# Patient Record
Sex: Male | Born: 1990 | Race: Black or African American | Hispanic: No | Marital: Single | Smoking: Never smoker
Health system: Southern US, Community
[De-identification: ages and names within clinical notes are randomized; demographics above are authoritative.]

## PROBLEM LIST (undated history)

## (undated) HISTORY — PX: NO PAST SURGERIES: SHX2092

---

## 2009-07-07 ENCOUNTER — Emergency Department: Payer: Self-pay | Admitting: Emergency Medicine

## 2010-07-06 ENCOUNTER — Emergency Department: Payer: Self-pay | Admitting: Internal Medicine

## 2011-07-11 ENCOUNTER — Emergency Department: Payer: Self-pay | Admitting: Unknown Physician Specialty

## 2011-07-11 LAB — BASIC METABOLIC PANEL
Anion Gap: 12 (ref 7–16)
BUN: 12 mg/dL (ref 7–18)
Chloride: 107 mmol/L (ref 98–107)
Co2: 26 mmol/L (ref 21–32)
EGFR (African American): >60
Osmolality: 288 (ref 275–301)

## 2011-07-11 LAB — CBC WITH DIFFERENTIAL/PLATELET
Basophil #: 0 10*3/uL (ref 0.0–0.1)
Eosinophil #: 0.1 10*3/uL (ref 0.0–0.7)
Eosinophil %: 1.4 %
MCH: 29.4 pg (ref 26.0–34.0)
MCHC: 33.9 g/dL (ref 32.0–36.0)
Monocyte #: 0.8 10*3/uL — ABNORMAL HIGH (ref 0.0–0.7)
Neutrophil %: 77.3 %
Platelet: 224 10*3/uL (ref 150–440)
RBC: 5.37 10*6/uL (ref 4.40–5.90)

## 2012-03-09 ENCOUNTER — Emergency Department (HOSPITAL_COMMUNITY): Payer: Worker's Compensation

## 2012-03-09 ENCOUNTER — Emergency Department (HOSPITAL_COMMUNITY)
Admission: EM | Admit: 2012-03-09 | Discharge: 2012-03-09 | Disposition: A | Discharge status: Home / Self Care | Payer: Worker's Compensation | Attending: Emergency Medicine | Admitting: Emergency Medicine

## 2012-03-09 ENCOUNTER — Encounter (HOSPITAL_COMMUNITY): Payer: Self-pay | Admitting: Emergency Medicine

## 2012-03-09 DIAGNOSIS — W010XXA Fall on same level from slipping, tripping and stumbling without subsequent striking against object, initial encounter: Secondary | ICD-10-CM | POA: Insufficient documentation

## 2012-03-09 DIAGNOSIS — M24419 Recurrent dislocation, unspecified shoulder: Secondary | ICD-10-CM | POA: Insufficient documentation

## 2012-03-09 MED ORDER — OXYCODONE-ACETAMINOPHEN 5-325 MG PO TABS: 2.0000 | ORAL_TABLET | ORAL | Status: DC | PRN

## 2012-03-09 MED ORDER — ONDANSETRON 4 MG PO TBDP
8.0000 mg | ORAL_TABLET | Freq: Once | ORAL | Status: AC
  Administered 2012-03-09: 8 mg via ORAL

## 2012-03-09 MED ORDER — ONDANSETRON 4 MG PO TBDP
ORAL_TABLET | ORAL | Status: AC
  Filled 2012-03-09: qty 2

## 2012-03-09 MED ORDER — PROPOFOL 10 MG/ML IV BOLUS
INTRAVENOUS | Status: AC
  Filled 2012-03-09: qty 20

## 2012-03-09 MED ORDER — PROPOFOL 10 MG/ML IV BOLUS: 0.5000 mg/kg | Freq: Once | INTRAVENOUS | Status: DC

## 2012-03-09 MED ORDER — DIAZEPAM 5 MG/ML IJ SOLN
5.0000 mg | Freq: Once | INTRAMUSCULAR | Status: AC
  Administered 2012-03-09: 5 mg via INTRAVENOUS
  Filled 2012-03-09: qty 2

## 2012-03-09 MED ORDER — FENTANYL CITRATE 0.05 MG/ML IJ SOLN
50.0000 ug | Freq: Once | INTRAMUSCULAR | Status: AC
  Administered 2012-03-09: 50 ug via INTRAVENOUS
  Filled 2012-03-09: qty 2

## 2012-03-09 MED ORDER — HYDROMORPHONE HCL PF 1 MG/ML IJ SOLN
1.0000 mg | Freq: Once | INTRAMUSCULAR | Status: AC
  Administered 2012-03-09: 1 mg via INTRAVENOUS
  Filled 2012-03-09: qty 1

## 2012-03-09 MED ORDER — MELOXICAM 15 MG PO TABS: 15.0000 mg | ORAL_TABLET | Freq: Every day | ORAL | Status: DC

## 2012-03-09 MED ORDER — SODIUM CHLORIDE 0.9 % IV SOLN
INTRAVENOUS | Status: AC | PRN
  Administered 2012-03-09: 1000 mL via INTRAVENOUS

## 2012-03-09 MED ORDER — PROPOFOL 10 MG/ML IV BOLUS
INTRAVENOUS | Status: AC | PRN
  Administered 2012-03-09: 20 mg via INTRAVENOUS
  Administered 2012-03-09 (×2): 40 mg via INTRAVENOUS
  Administered 2012-03-09: 80 mg via INTRAVENOUS

## 2012-03-09 NOTE — ED Notes (Addendum)
MD at bedside. Dr. Bebe Shaggy. This RN will return to evaluate pt.

## 2012-03-09 NOTE — ED Notes (Signed)
MDs at bedside

## 2012-03-09 NOTE — Progress Notes (Signed)
Orthopedic Tech Progress Note Patient Details:  Marcus Cook 11-Oct-1990 [id number]  Ortho Devices Type of Ortho Device: Sling immobilizer Ortho Device/Splint Location: right UE Ortho Device/Splint Interventions: Application   [name] 03/09/2012, 4:54 PM

## 2012-03-09 NOTE — ED Notes (Signed)
EDPA, Robin at bedside.

## 2012-03-09 NOTE — ED Notes (Signed)
Pt ready to be discharged. Pt waiting for family to pick up from hospital. Pt is resting and still drowsy. Vitals normal.

## 2012-03-09 NOTE — ED Provider Notes (Signed)
History     CSN: [id number]  Arrival date & time 03/09/12  1158   First MD Initiated Contact with Patient 03/09/12 1200      Chief Complaint  Patient presents with  . Shoulder Injury    (Consider location/radiation/quality/duration/timing/severity/associated sxs/prior treatment) HPI Comments: Marcus Cook 21 y.o. male   The chief complaint is: Patient presents with:   Shoulder Injury    21 year old male with 10+, dislocations of Right shoulder presents today with chief complaint of right shoulder dislocation. 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 He tripped and fell onto an outstretched hand. Felt his shoulder dislocate. He is not sure if it was, posterior or anterior. Patient arrived via EMS, given 100 of fentanyl. He denies any pain at this time. Patient denies any numbness or tingling. He claims that he is able to move his fingers.    Patient is a 21 y.o. male presenting with shoulder injury. The history is provided by the patient.  Shoulder Injury This is a new problem. The current episode started today. The problem has been unchanged. Associated symptoms include joint swelling. Pertinent negatives include no abdominal pain, anorexia, arthralgias, change in bowel habit, chest pain, chills, congestion, coughing, diaphoresis, fatigue, fever, headaches, myalgias, nausea, neck pain, numbness, rash, sore throat, swollen glands, urinary symptoms, vertigo, visual change, vomiting or weakness. Exacerbated by: Movement. He has tried oral narcotics (Sling-and-swathe) for the symptoms. The treatment provided significant relief.    No past medical history on file.  No past surgical history on file.  No family history on file.  History  Substance Use Topics  . Smoking status: Not on file  . Smokeless tobacco: Not on file  . Alcohol Use: Not on file      Review of Systems  Constitutional: Negative for fever, chills, diaphoresis and fatigue.  HENT: Negative for  congestion, sore throat and neck pain.   Eyes: Negative for visual disturbance.  Respiratory: Negative for cough.   Cardiovascular: Negative for chest pain.  Gastrointestinal: Negative for nausea, vomiting, abdominal pain, anorexia and change in bowel habit.  Musculoskeletal: Positive for joint swelling. Negative for myalgias and arthralgias.  Skin: Negative for rash.  Neurological: Negative for vertigo, weakness, numbness and headaches.    Allergies  Review of patient's allergies indicates not on file.  Home Medications  No current outpatient prescriptions on file.  There were no vitals taken for this visit.  Physical Exam  Nursing note and vitals reviewed. Constitutional: He appears well-developed and well-nourished. No distress.  HENT:  Head: Normocephalic and atraumatic.  Eyes: Conjunctivae normal are normal. No scleral icterus.  Neck: Normal range of motion. Neck supple.  Cardiovascular: Normal rate, regular rhythm and normal heart sounds.   Pulmonary/Chest: Effort normal and breath sounds normal. No respiratory distress.  Abdominal: Soft. There is no tenderness.  Musculoskeletal:       Right shoulder: He exhibits deformity and pain. He exhibits no laceration, no spasm, normal pulse and normal strength.       With obvious deformity of the right shoulder. Sulcus sign present. Good radial pulses. Patient is able to move fingers. Denies any paresthesias or numbness.  Neurological: He is alert.  Skin: Skin is warm and dry. He is not diaphoretic.  Psychiatric: His behavior is normal.    ED Course  Reduction of dislocation Performed by: Arthor Captain Authorized by: Arthor Captain Consent: Verbal consent obtained. Written consent obtained. Risks and benefits: risks, benefits and alternatives were discussed Consent given by: patient  Patient understanding: patient states understanding of the procedure being performed Patient consent: the patient's understanding of the  procedure matches consent given Site marked: the operative site was marked Imaging studies: imaging studies available Patient identity confirmed: verbally with patient Patient sedated: yes Sedatives: propofol Analgesia: fentanyl Vitals: Vital signs were monitored during sedation. Patient tolerance: Patient tolerated the procedure well with no immediate complications.   (including critical care time)  Procedural sedation Performed by: Arthor Captain Consent: Verbal consent obtained. Risks and benefits: risks, benefits and alternatives were discussed Required items: required blood products, implants, devices, and special equipment available Patient identity confirmed: arm band and provided demographic data Time out: Immediately prior to procedure a "time out" was called to verify the correct patient, procedure, equipment, support staff and site/side marked as required.  Sedation type: moderate (conscious) sedation NPO time confirmed and considedered  Sedatives: PROPOFOL  Physician Time at Bedside: 4:00 PM  Vitals: Vital signs were monitored during sedation. Cardiac Monitor, pulse oximeter Patient tolerance: Patient tolerated the procedure well with no immediate complications. Comments: Pt with uneventful recovered. Returned to pre-procedural sedation baseline    Labs Reviewed - No data to display Dg Shoulder Right  03/09/2012  *RADIOLOGY REPORT*  Clinical Data: Shoulder dislocation  RIGHT SHOULDER - 2+ VIEW  Comparison: None.  Findings: Frontal and Y scapular views were obtained.  There is a subcoracoid anterior dislocation.  No fracture.  No bony destruction or erosive change.  IMPRESSION: Subcoracoid anterior dislocation.   Original Report Authenticated By: Arvin Collard. WOODRUFF III, M.D.      1. Shoulder dislocation, recurrent       MDM  Patient with a subcoracoid anterior dislocation of the right shoulder. Patient has been given to milligrams the allotted and 5 of  Valium. Attempts to reduce his shoulder without conscious sedation was unsuccessful. Hehas agreed to conscious sedation.    Post reduction films obtained, I have spoken with Dr. August Saucer who will have the patient follow up. Patient given sling, pain control and instructions for care .  I have given report of patietn for PA Johnnette Gourd who will d/c patient after observation from procedural sedation.      Arthor Captain, PA-C 03/10/12 2025  Arthor Captain, PA-C 03/10/12 2026

## 2012-03-09 NOTE — ED Notes (Signed)
Pt became nauseous upon entering waiting room, assisted to a seat, green bag given, new orders obtained and completed for Zofran 8 mg ODT

## 2012-03-09 NOTE — ED Notes (Signed)
Mother arrived and at bedside. Preparing to discharge pt.

## 2012-03-09 NOTE — ED Notes (Signed)
Pt was in blow-up obstacle course and dislocated shoulder.  150/90 100 hr 97% ra.  Pt alert oriented X4  Pain 3/10 after immobalization.  EMS gave 100 fentanyl. Per EMS shoulder is obviously deformed and dislocated

## 2012-03-11 NOTE — ED Provider Notes (Signed)
Medical screening examination/treatment/procedure(s) were conducted as a shared visit with non-physician practitioner(s) and myself.  I personally evaluated the patient during the encounter  Pt seen with PA, he had shoulder dislocation I was present for entire reduction and sedation procedure.  PT tolerated procedure well.  Initially attempted reduction with pain meds but pt did not tolerate reduction without sedation  Total sedation time - approximately 20 minutes  Joya Gaskins, MD 03/11/12 2042

## 2014-10-16 ENCOUNTER — Ambulatory Visit
Admission: RE | Admit: 2014-10-16 | Discharge: 2014-10-16 | Disposition: A | Discharge status: Home / Self Care | Payer: 59 | Source: Ambulatory Visit | Attending: Family Medicine | Admitting: Family Medicine

## 2014-10-16 ENCOUNTER — Other Ambulatory Visit: Payer: Self-pay | Admitting: Family Medicine

## 2014-10-16 DIAGNOSIS — M25579 Pain in unspecified ankle and joints of unspecified foot: Secondary | ICD-10-CM

## 2019-04-01 ENCOUNTER — Encounter: Payer: Self-pay | Admitting: Emergency Medicine

## 2019-04-01 ENCOUNTER — Emergency Department: Payer: Managed Care, Other (non HMO)

## 2019-04-01 ENCOUNTER — Emergency Department
Admission: EM | Admit: 2019-04-01 | Discharge: 2019-04-01 | Disposition: A | Discharge status: Home / Self Care | Payer: Managed Care, Other (non HMO) | Attending: Student in an Organized Health Care Education/Training Program | Admitting: Student in an Organized Health Care Education/Training Program

## 2019-04-01 DIAGNOSIS — S43005A Unspecified dislocation of left shoulder joint, initial encounter: Secondary | ICD-10-CM | POA: Diagnosis not present

## 2019-04-01 DIAGNOSIS — W010XXA Fall on same level from slipping, tripping and stumbling without subsequent striking against object, initial encounter: Secondary | ICD-10-CM | POA: Insufficient documentation

## 2019-04-01 DIAGNOSIS — S63657A Sprain of metacarpophalangeal joint of left little finger, initial encounter: Secondary | ICD-10-CM | POA: Insufficient documentation

## 2019-04-01 DIAGNOSIS — T07XXXA Unspecified multiple injuries, initial encounter: Secondary | ICD-10-CM

## 2019-04-01 DIAGNOSIS — S4992XA Unspecified injury of left shoulder and upper arm, initial encounter: Secondary | ICD-10-CM | POA: Diagnosis present

## 2019-04-01 DIAGNOSIS — Y92830 Public park as the place of occurrence of the external cause: Secondary | ICD-10-CM | POA: Diagnosis not present

## 2019-04-01 DIAGNOSIS — Y998 Other external cause status: Secondary | ICD-10-CM | POA: Insufficient documentation

## 2019-04-01 DIAGNOSIS — Y9302 Activity, running: Secondary | ICD-10-CM | POA: Diagnosis not present

## 2019-04-01 MED ORDER — BACITRACIN-NEOMYCIN-POLYMYXIN 400-5-5000 EX OINT
TOPICAL_OINTMENT | Freq: Once | CUTANEOUS | Status: AC
  Administered 2019-04-01: 1 via TOPICAL
  Filled 2019-04-01: qty 1

## 2019-04-01 MED ORDER — OXYCODONE HCL 5 MG PO TABS: 5.0000 mg | ORAL_TABLET | Freq: Three times a day (TID) | ORAL | 0 refills | Status: AC | PRN

## 2019-04-01 MED ORDER — FENTANYL CITRATE (PF) 100 MCG/2ML IJ SOLN
100.0000 ug | Freq: Once | INTRAMUSCULAR | Status: AC
  Administered 2019-04-01: 100 ug via INTRAVENOUS
  Filled 2019-04-01: qty 2

## 2019-04-01 MED ORDER — ONDANSETRON HCL 4 MG/2ML IJ SOLN
4.0000 mg | Freq: Once | INTRAMUSCULAR | Status: AC
  Administered 2019-04-01: 4 mg via INTRAVENOUS
  Filled 2019-04-01: qty 2

## 2019-04-01 MED ORDER — NAPROXEN 500 MG PO TABS: 500.0000 mg | ORAL_TABLET | Freq: Two times a day (BID) | ORAL | 0 refills | Status: DC

## 2019-04-01 NOTE — ED Provider Notes (Signed)
[facility] Emergency Department Provider Note ____________________________________________  Time seen: Approximately 8:43 PM  I have reviewed the triage vital signs and the nursing notes.   HISTORY  Chief Complaint Shoulder Pain    HPI Marcus Cook is a 28 y.o. male who presents to the emergency department for evaluation and treatment of left shoulder pain, left hand pain, and abrasions to the lower extremities.  Patient states that he was running in the park and must of tripped on a tree root and fell.  He states that his left shoulder is dislocated.  He has never dislocated the left shoulder but he has dislocated the right shoulder.  No alleviating measures attempted prior to arrival.  No past medical history on file.  There are no active problems to display for this patient.   No past surgical history on file.  Prior to Admission medications   Medication Sig Start Date End Date Taking? Authorizing Provider  loratadine (CLARITIN) 10 MG tablet Take 10 mg by mouth daily as needed. For allergies    [provider]  meloxicam (MOBIC) 15 MG tablet Take 1 tablet (15 mg total) by mouth daily. 03/09/12   Harris, Cammy Copa, PA-C  naproxen (NAPROSYN) 500 MG tablet Take 1 tablet (500 mg total) by mouth 2 (two) times daily with a meal. 04/01/19   Deandre Brannan B, FNP  oxyCODONE (ROXICODONE) 5 MG immediate release tablet Take 1 tablet (5 mg total) by mouth every 8 (eight) hours as needed. 04/01/19 03/31/20  Amberly Livas, Rulon Eisenmenger B, FNP  oxyCODONE-acetaminophen (PERCOCET) 5-325 MG per tablet Take 2 tablets by mouth every 4 (four) hours as needed for pain. 03/09/12   Arthor Captain, PA-C  simethicone (MYLICON) 80 MG chewable tablet Chew 80 mg by mouth every 6 (six) hours as needed. For gas    [provider]    Allergies Patient has no known allergies.  No family history on file.  Social History Social History   Tobacco Use  . Smoking status:  Never Smoker  . Smokeless tobacco: Never Used  Substance Use Topics  . Alcohol use: Yes    Comment: occasionally   . Drug use: No    Review of Systems Constitutional: Negative for fever. Cardiovascular: Negative for chest pain. Respiratory: Negative for shortness of breath. Musculoskeletal: Positive for left shoulder pain.  Positive for left hand pain. Skin: Positive for scattered abrasions along the hands and knees. Neurological: Negative for decrease in sensation  ____________________________________________   PHYSICAL EXAM:  VITAL SIGNS: ED Triage Vitals  Enc Vitals Group     BP 04/01/19 2022 (!) 156/104     Pulse Rate 04/01/19 2022 61     Resp 04/01/19 2022 18     Temp 04/01/19 2022 98.8 F (37.1 C)     Temp Source 04/01/19 2022 Oral     SpO2 04/01/19 2022 100 %     Weight --      Height --      Head Circumference --      Peak Flow --      Pain Score 04/01/19 2025 4     Pain Loc --      Pain Edu? --      Excl. in GC? --     Constitutional: Alert and oriented. Well appearing and in no acute distress. Eyes: Conjunctivae are clear without discharge or drainage Head: Atraumatic Neck: Supple. Respiratory: No cough. Respirations are even and unlabored. Musculoskeletal: Obvious step off deformity of the left shoulder  Neurologic: Sensory function of the left hand is intact. Skin: Abrasions noted to the prepatellar surfaces on both knees. Psychiatric: Affect and behavior are appropriate.  ____________________________________________   LABS (all labs ordered are listed, but only abnormal results are displayed)  Labs Reviewed - No data to display ____________________________________________  RADIOLOGY  Anterior, inferior left shoulder dislocation. ____________________________________________   PROCEDURES  .Ortho Injury Treatment  Date/Time: 04/01/2019 10:08 PM Performed by: Victorino Dike, FNP Authorized by: Victorino Dike, FNP   Consent:     Consent obtained:  Verbal   Consent given by:  Patient   Risks discussed:  Irreducible dislocation, fracture and nerve damageInjury location: shoulder Location details: left shoulder Injury type: dislocation Dislocation type: anterior Pre-procedure neurovascular assessment: neurovascularly intact Pre-procedure distal perfusion: normal Pre-procedure neurological function: normal Pre-procedure range of motion: reduced  Patient sedated: NoManipulation performed: yes Reduction method: external rotation, traction and counter traction and scapular manipulation Reduction successful: yes X-ray confirmed reduction: yes Immobilization: Shoulder Immobilizer. Supplies used: Velcro shoulder immobilizer. Post-procedure neurovascular assessment: post-procedure neurovascularly intact Post-procedure distal perfusion: normal Post-procedure neurological function: normal Patient tolerance: patient tolerated the procedure well with no immediate complications     ____________________________________________   INITIAL IMPRESSION / ASSESSMENT AND PLAN / ED COURSE  Marcus Cook is a 28 y.o. who presents to the emergency department for treatment and evaluation after mechanical, nonsyncopal fall prior to arrival.  He has an obvious left shoulder dislocation. Will attempt reduction after Fentanyl. If more sedation is required, he will need to be moved for MD monitoring.  ----------------------------------------- 10:07 PM on 04/01/2019 -----------------------------------------  Patient tolerated the procedure well. Reduction completed by myself and Betha Loa, PA-C. Post-reduction image shows no fracture or dislocation. Patient states pain is "2 or 3." See procedure note for details.   Wound care instructions provided.  Patient instructed to follow-up with orthopedics as soon as possible. He was also instructed to return to the emergency department for symptoms that change or worsen if  unable schedule an appointment with orthopedics or primary care.  Medications  neomycin-bacitracin-polymyxin (NEOSPORIN) ointment packet (has no administration in time range)  fentaNYL (SUBLIMAZE) injection 100 mcg (100 mcg Intravenous Given 04/01/19 2041)  ondansetron (ZOFRAN) injection 4 mg (4 mg Intravenous Given 04/01/19 2038)    Pertinent labs & imaging results that were available during my care of the patient were reviewed by me and considered in my medical decision making (see chart for details).  _________________________________________   FINAL CLINICAL IMPRESSION(S) / ED DIAGNOSES  Final diagnoses:  Shoulder dislocation, left, initial encounter  Abrasions of multiple sites  Sprain of metacarpophalangeal (MCP) joint of left little finger, initial encounter    ED Discharge Orders         Ordered    oxyCODONE (ROXICODONE) 5 MG immediate release tablet  Every 8 hours PRN     04/01/19 2204    naproxen (NAPROSYN) 500 MG tablet  2 times daily with meals     04/01/19 2204           If controlled substance prescribed during this visit, 12 month history viewed on the [place] prior to issuing an initial prescription for Schedule II or III opiod.   Victorino Dike, FNP 04/01/19 2215    Merlyn Lot, MD 04/01/19 2352

## 2019-04-01 NOTE — ED Notes (Signed)
Patient tolerated reduction of left shoulder well.

## 2019-04-01 NOTE — ED Triage Notes (Signed)
[place] he [place] over root and landed onto knees. Pt with obvious right sided shoulder dislocation.

## 2019-04-01 NOTE — Discharge Instructions (Signed)
Please call and schedule a follow up appointment with Dr. Mack Guise.  Keep your wounds clean and dry.   You may take the immobilizer off to shower, but DO NOT raise your arm above your head.  Return to the ER for symptoms of concern if unable to see your primary care provider or the specialist.

## 2019-04-01 NOTE — ED Notes (Signed)
Patient taken to xray.

## 2019-09-14 ENCOUNTER — Ambulatory Visit: Payer: Managed Care, Other (non HMO) | Attending: Internal Medicine

## 2019-09-14 DIAGNOSIS — Z23 Encounter for immunization: Secondary | ICD-10-CM

## 2019-09-14 NOTE — Progress Notes (Signed)
   Covid-19 Vaccination Clinic  Name:  Marcus Cook    MRN: [id number] DOB: 1990/07/30  09/14/2019  [name] was observed post Covid-19 immunization for 15 minutes without incident. He was provided with Vaccine Information Sheet and instruction to access the V-Safe system.   [name] was instructed to call 911 with any severe reactions post vaccine: Marland Kitchen Difficulty breathing  . Swelling of face and throat  . A fast heartbeat  . A bad rash all over body  . Dizziness and weakness   Immunizations Administered    Name Date Dose VIS Date Route   Pfizer COVID-19 Vaccine 09/14/2019  1:14 PM 0.3 mL 05/17/2019 Intramuscular   Manufacturer: [employer], [employer]   Lot: [id number]   NDC: [address]-4

## 2019-10-08 ENCOUNTER — Ambulatory Visit: Payer: Managed Care, Other (non HMO) | Attending: Internal Medicine

## 2019-10-08 DIAGNOSIS — Z23 Encounter for immunization: Secondary | ICD-10-CM

## 2019-10-08 NOTE — Progress Notes (Signed)
   Covid-19 Vaccination Clinic  Name:  Marcus Cook    MRN: [id number] DOB: 11-11-1990  10/08/2019  Mr. Marcus Cook was observed post Covid-19 immunization for 15 minutes without incident. He was provided with Vaccine Information Sheet and instruction to access the V-Safe system.   Mr. Marcus Cook was instructed to call 911 with any severe reactions post vaccine: Marland Kitchen Difficulty breathing  . Swelling of face and throat  . A fast heartbeat  . A bad rash all over body  . Dizziness and weakness   Immunizations Administered    Name Date Dose VIS Date Route   Pfizer COVID-19 Vaccine 10/08/2019  1:24 PM 0.3 mL 07/31/2018 Intramuscular   Manufacturer: [employer], [employer]   Lot: [id number]   NDC: [address]-5

## 2021-02-24 ENCOUNTER — Ambulatory Visit (INDEPENDENT_AMBULATORY_CARE_PROVIDER_SITE_OTHER): Payer: Managed Care, Other (non HMO) | Admitting: Family Medicine

## 2021-02-24 ENCOUNTER — Encounter: Payer: Self-pay | Admitting: Family Medicine

## 2021-02-24 ENCOUNTER — Other Ambulatory Visit: Payer: Self-pay

## 2021-02-24 VITALS — BP 145/107 | HR 81 | Temp 98.4°F | Ht 71.0 in | Wt 165.0 lb

## 2021-02-24 DIAGNOSIS — Z23 Encounter for immunization: Secondary | ICD-10-CM

## 2021-02-24 DIAGNOSIS — I10 Essential (primary) hypertension: Secondary | ICD-10-CM

## 2021-02-24 MED ORDER — AMLODIPINE BESYLATE 2.5 MG PO TABS: 2.5000 mg | ORAL_TABLET | Freq: Every day | ORAL | 3 refills | Status: DC

## 2021-02-24 NOTE — Patient Instructions (Signed)
Please review the attached list of medications and notify my office if there are any errors.   Please bring a copy of you most recent lab work to your next appointment

## 2021-02-24 NOTE — Progress Notes (Signed)
Established patient visit   Patient: Marcus Cook   DOB: 06/29/1990   30 y.o. Male  MRN: [id number] Visit Date: 02/24/2021  Today's healthcare provider: [name], MD   Chief Complaint  Patient presents with   Hypertension   Subjective    Hypertension This is a new problem. The problem has been gradually worsening since onset. The problem is uncontrolled. Associated symptoms include anxiety. Pertinent negatives include no blurred vision, headaches, malaise/fatigue, neck pain, palpitations, peripheral edema or shortness of breath. There are no associated agents to hypertension. Past treatments include nothing.   He states he found out he had high blood pressure through recent screening done at [employer] where he works. He runs for exercise about 30 minutes 3 times a week, but also admits to consuming a lot of salty foods.    Medications: Outpatient Medications Prior to Visit  Medication Sig   loratadine (CLARITIN) 10 MG tablet Take 10 mg by mouth daily as needed. For allergies   meloxicam (MOBIC) 15 MG tablet Take 1 tablet (15 mg total) by mouth daily.   naproxen (NAPROSYN) 500 MG tablet Take 1 tablet (500 mg total) by mouth 2 (two) times daily with a meal.   oxyCODONE-acetaminophen (PERCOCET) 5-325 MG per tablet Take 2 tablets by mouth every 4 (four) hours as needed for pain.   simethicone (MYLICON) 80 MG chewable tablet Chew 80 mg by mouth every 6 (six) hours as needed. For gas   No facility-administered medications prior to visit.    Review of Systems  Constitutional: Negative.  Negative for malaise/fatigue.  Eyes: Negative.  Negative for blurred vision.  Respiratory: Negative.  Negative for shortness of breath.   Cardiovascular: Negative.  Negative for palpitations.  Gastrointestinal:  Positive for abdominal distention. Negative for abdominal pain, anal bleeding, blood in stool, constipation, diarrhea, nausea, rectal pain and vomiting.  Musculoskeletal:   Negative for neck pain.  Neurological:  Negative for dizziness, syncope, speech difficulty, light-headedness, numbness and headaches.      Objective    BP (!) 145/107 (BP Location: Right Arm, Patient Position: Sitting, Cuff Size: Large)   Pulse 81   Temp 98.4 F (36.9 C) (Oral)   Ht 5\' 11"  (1.803 m)   Wt 165 lb (74.8 kg)   SpO2 100%   BMI 23.01 kg/m    Physical Exam   General: Appearance:    Well developed, well nourished male in no acute distress  Eyes:    PERRL, conjunctiva/corneas clear, EOM's intact       Lungs:     Clear to auscultation bilaterally, respirations unlabored  Heart:    Normal heart rate. Normal rhythm. No murmurs, rubs, or gallops.    MS:   All extremities are intact.    Neurologic:   Awake, alert, oriented x 3. No apparent focal neurological defect.         Assessment & Plan     1. Primary hypertension Counseled to reduce sodium in diet. Continuie regular exercise. Asked to bring a copy of his recent [facility] screening labs to his follow up visit. Will start amLODipine (NORVASC) 2.5 MG tablet; Take 1 tablet (2.5 mg total) by mouth daily.  Dispense: 30 tablet; Refill: 3  2. Need for influenza vaccination  - Flu Vaccine QUAD 6+ mos PF IM (Fluarix Quad PF)   Future Appointments  Date Time Provider Department Center  04/09/2021  4:00 PM 13/09/2020 Sherrie Mustache, MD [facility]  The entirety of the information documented in the History of Present Illness, Review of Systems and Physical Exam were personally obtained by me. Portions of this information were initially documented by the CMA and reviewed by me for thoroughness and accuracy.     Lelon Huh, MD  [facility] [phone] (phone) [phone] (fax)  [place]

## 2021-04-09 ENCOUNTER — Other Ambulatory Visit: Payer: Self-pay

## 2021-04-09 ENCOUNTER — Ambulatory Visit (INDEPENDENT_AMBULATORY_CARE_PROVIDER_SITE_OTHER): Payer: Managed Care, Other (non HMO) | Admitting: Family Medicine

## 2021-04-09 ENCOUNTER — Encounter: Payer: Self-pay | Admitting: Family Medicine

## 2021-04-09 VITALS — BP 132/89 | HR 112 | Temp 98.5°F | Resp 16 | Wt 163.0 lb

## 2021-04-09 DIAGNOSIS — I10 Essential (primary) hypertension: Secondary | ICD-10-CM | POA: Diagnosis not present

## 2021-04-09 NOTE — Progress Notes (Signed)
Established patient visit   Patient: Marcus Cook   DOB: 1991/04/30   30 y.o. Male  MRN: [id number] Visit Date: 04/09/2021  Today's healthcare provider: [name], MD   Chief Complaint  Patient presents with   Hypertension   Subjective    HPI  Hypertension, follow-up  BP Readings from Last 3 Encounters:  04/09/21 132/89  02/24/21 (!) 145/107  04/01/19 (!) 167/94   Wt Readings from Last 3 Encounters:  04/09/21 163 lb (73.9 kg)  02/24/21 165 lb (74.8 kg)     He was last seen for hypertension 02/24/2021.  BP at that visit was 145/107. During that visit patient was counseling to reduce sodium in diet and to continue regular exercise. Patient was asked to bring a copy of his recent [facility] screening labs to his follow up visit. Started amLODipine (NORVASC) 2.5 MG tablet; Take 1 tablet (2.5 mg total) by mouth daily.  He reports fair compliance with treatment. He is not having side effects.  He is following a Regular diet. He is exercising. He does not smoke.  Use of agents associated with hypertension: none.   Outside blood pressures are not checked. Symptoms: No chest pain No chest pressure  No palpitations No syncope  No dyspnea No orthopnea  No paroxysmal nocturnal dyspnea No lower extremity edema   Pertinent labs: No results found for: CHOL, HDL, LDLCALC, LDLDIRECT, TRIG, CHOLHDL Lab Results  Component Value Date   NA 145 07/11/2011   K 3.5 07/11/2011   CREATININE 1.06 07/11/2011   GFRNONAA >60 07/11/2011   GLUCOSE 97 07/11/2011     The ASCVD Risk score (Arnett DK, et al., 2019) failed to calculate for the following reasons:   The 2019 ASCVD risk score is only valid for ages 25 to 38   ---------------------------------------------------------------------------------------------------     Medications: Outpatient Medications Prior to Visit  Medication Sig   amLODipine (NORVASC) 2.5 MG tablet Take 1 tablet (2.5 mg total) by mouth daily.    No facility-administered medications prior to visit.    Review of Systems  Constitutional:  Negative for appetite change, chills and fever.  Respiratory:  Negative for chest tightness, shortness of breath and wheezing.   Cardiovascular:  Negative for chest pain and palpitations.  Gastrointestinal:  Negative for abdominal pain, nausea and vomiting.      Objective    BP 132/89 (BP Location: Left Arm, Patient Position: Sitting, Cuff Size: Normal)   Pulse (!) 112   Temp 98.5 F (36.9 C) (Oral)   Resp 16   Wt 163 lb (73.9 kg)   SpO2 100% Comment: room  air  BMI 22.73 kg/m  {Show previous vital signs (optional):23777}  Physical Exam   General appearance: Well developed, well nourished male, cooperative and in no acute distress Head: Normocephalic, without obvious abnormality, atraumatic Respiratory: Respirations even and unlabored, normal respiratory rate Extremities: All extremities are intact.  Skin: Skin color, texture, turgor normal. No rashes seen  Psych: Appropriate mood and affect. Neurologic: Mental status: Alert, oriented to person, place, and time, thought content appropriate.    Assessment & Plan     1. Primary hypertension Much better since starting amlodipine.  - Lipid panel - Renal function panel       The entirety of the information documented in the History of Present Illness, Review of Systems and Physical Exam were personally obtained by me. Portions of this information were initially documented by the CMA and reviewed by me for thoroughness and  accuracy.     Lelon Huh, MD  [facility] [phone] (phone) [phone] (fax)  [place]

## 2021-04-10 LAB — RENAL FUNCTION PANEL
Albumin: 4.9 g/dL (ref 4.0–5.0)
BUN/Creatinine Ratio: 8 — ABNORMAL LOW (ref 9–20)
BUN: 9 mg/dL (ref 6–20)
CO2: 23 mmol/L (ref 20–29)
Calcium: 9.7 mg/dL (ref 8.7–10.2)
Chloride: 102 mmol/L (ref 96–106)
Creatinine, Ser: 1.15 mg/dL (ref 0.76–1.27)
Glucose: 96 mg/dL (ref 70–99)
Phosphorus: 4 mg/dL (ref 2.8–4.1)
Potassium: 3.8 mmol/L (ref 3.5–5.2)
Sodium: 141 mmol/L (ref 134–144)
eGFR: 87 mL/min/{1.73_m2} (ref >59)

## 2021-04-10 LAB — LIPID PANEL
Chol/HDL Ratio: 2.2 ratio (ref 0.0–5.0)
Cholesterol, Total: 155 mg/dL (ref 100–199)
HDL: 69 mg/dL (ref >39)
LDL Chol Calc (NIH): 73 mg/dL (ref 0–99)
Triglycerides: 65 mg/dL (ref 0–149)
VLDL Cholesterol Cal: 13 mg/dL (ref 5–40)

## 2021-04-24 IMAGING — CR DG SHOULDER 2+V*L*
3 series · 3 of 3 positions shown · non-contrast
Comparison: None.

CLINICAL DATA: Shoulder pain, deformity

EXAM:
LEFT SHOULDER - 2+ VIEW

[shoulder grashey]
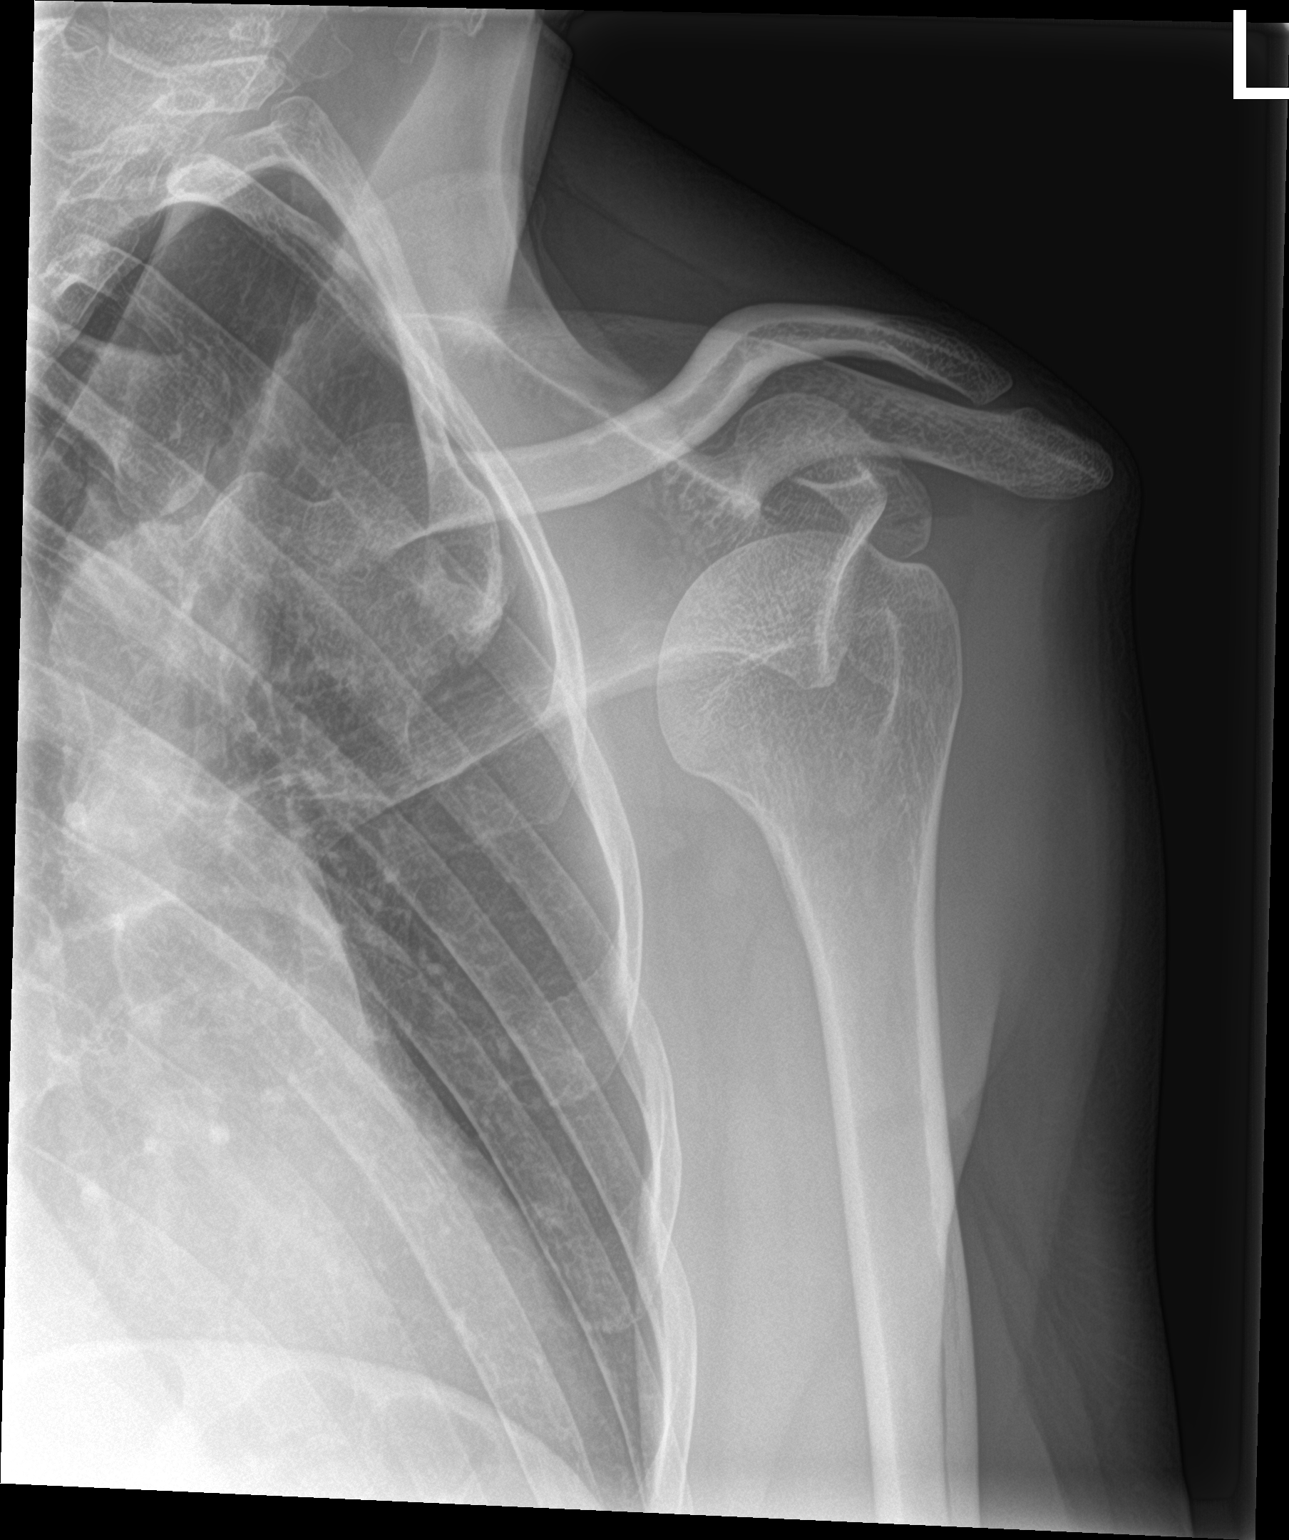

[shoulder y view]
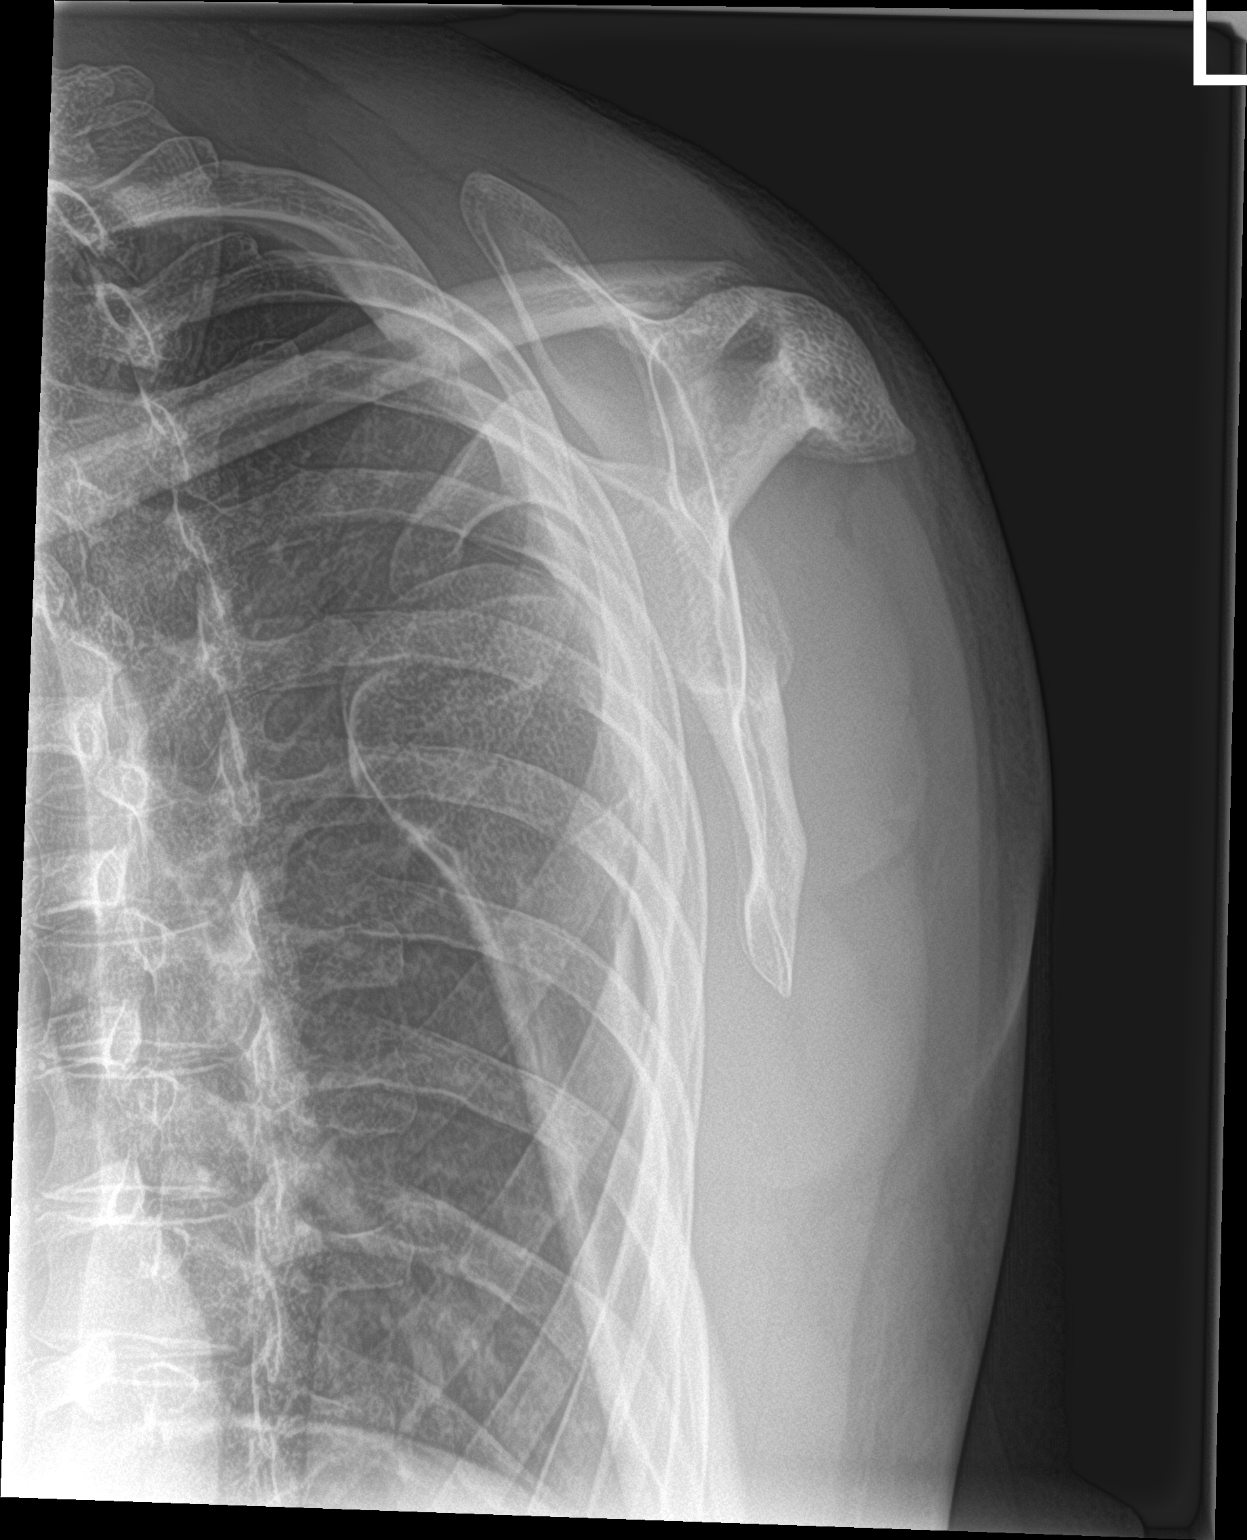

[shoulder ap neutral]
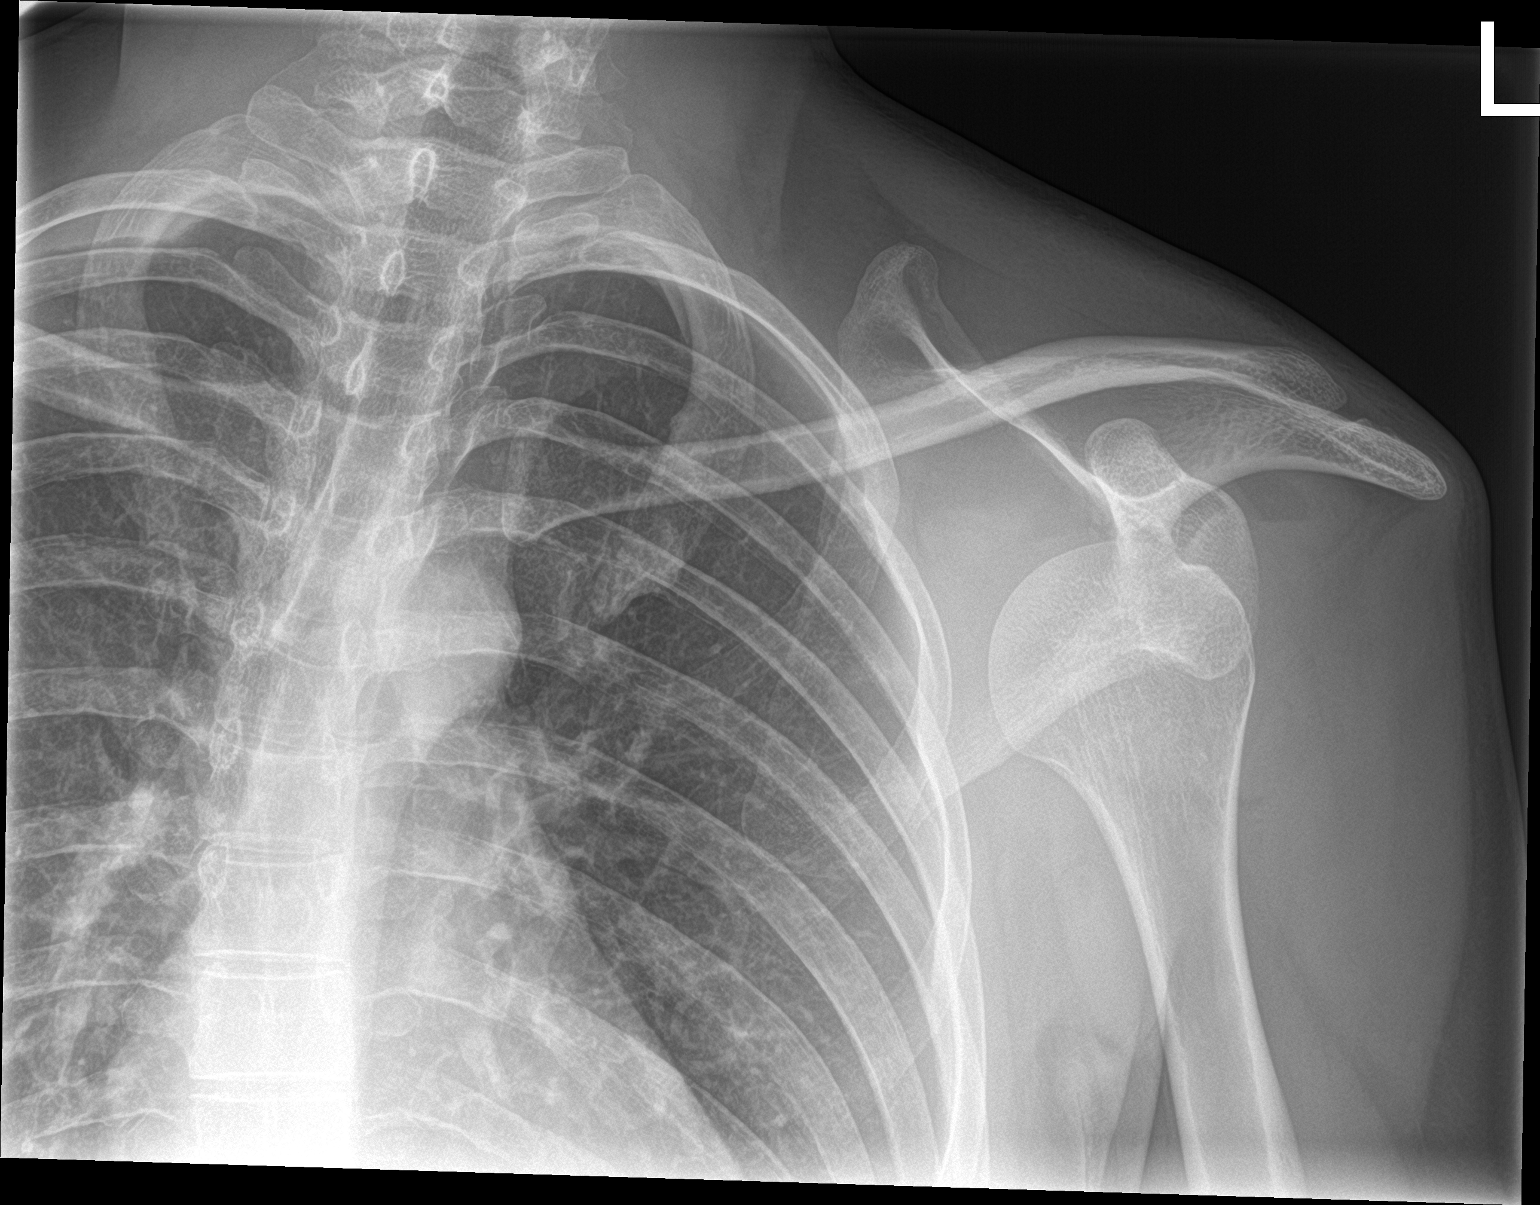

[3 of 3 positions shown; findings below may reference images not displayed]

FINDINGS: Anterior inferior dislocation of the humeral head in relation to the
glenoid. No acute osseous finding. AC joint aligned. Included left
chest unremarkable.
IMPRESSION: Anterior inferior left shoulder dislocation.

## 2021-08-20 ENCOUNTER — Ambulatory Visit: Payer: Managed Care, Other (non HMO) | Admitting: Family Medicine

## 2021-09-09 NOTE — Progress Notes (Signed)
?  ? ? ?I,Roshena L Chambers,acting as a scribe for Lelon Huh, MD.,have documented all relevant documentation on the behalf of Lelon Huh, MD,as directed by  Lelon Huh, MD while in the presence of Lelon Huh, MD.  ? ? ?Established patient visit ? ? ?Patient: Marcus Cook   DOB: [birth year]   31 y.o. Male  MRN: [id number] ?Visit Date: 09/10/2021 ? ?Today's healthcare provider: Lelon Huh, MD  ? ?Chief Complaint  ?Patient presents with  ? Hypertension  ? ?Subjective  ?  ?HPI  ?Hypertension, follow-up ? ?BP Readings from Last 3 Encounters:  ?09/10/21 (!) 157/108  ?04/09/21 132/89  ?02/24/21 (!) 145/107  ? Wt Readings from Last 3 Encounters:  ?09/10/21 168 lb (76.2 kg)  ?04/09/21 163 lb (73.9 kg)  ?02/24/21 165 lb (74.8 kg)  ?  ? ?He was last seen for hypertension 5 months ago.  ?BP at that visit was 132/89. Management since that visit includes continue same medication. ? ?He reports fair compliance with treatment. Patient has missed 4 days of medication this past week.  ?He is not having side effects.  ?He is following a Regular diet. ?He is exercising. ?He does not smoke. ? ?Use of agents associated with hypertension: none.  ? ?Outside blood pressures are not checked. ?Symptoms: ?No chest pain No chest pressure  ?No palpitations No syncope  ?No dyspnea No orthopnea  ?No paroxysmal nocturnal dyspnea No lower extremity edema  ? ?Pertinent labs ?Lab Results  ?Component Value Date  ? CHOL 155 04/09/2021  ? HDL 69 04/09/2021  ? [place] 73 04/09/2021  ? TRIG 65 04/09/2021  ? CHOLHDL 2.2 04/09/2021  ? Lab Results  ?Component Value Date  ? NA 141 04/09/2021  ? K 3.8 04/09/2021  ? CREATININE 1.15 04/09/2021  ? EGFR 87 04/09/2021  ? GLUCOSE 96 04/09/2021  ?  ? ?The ASCVD Risk score (Arnett DK, et al., 2019) failed to calculate for the following reasons: ?  The 2019 ASCVD risk score is only valid for ages 64 to  43 ? ?---------------------------------------------------------------------------------------------------  ? ?Medications: ?Outpatient Medications Prior to Visit  ?Medication Sig  ? amLODipine (NORVASC) 2.5 MG tablet Take 1 tablet (2.5 mg total) by mouth daily.  ? ?No facility-administered medications prior to visit.  ? ? ?Review of Systems  ?Constitutional:  Negative for appetite change, chills and fever.  ?Respiratory:  Negative for chest tightness, shortness of breath and wheezing.   ?Cardiovascular:  Negative for chest pain and palpitations.  ?Gastrointestinal:  Negative for abdominal pain, nausea and vomiting.  ? ? ?  Objective  ?  ?BP (!) 157/108 (BP Location: Right Arm, Patient Position: Sitting, Cuff Size: Normal)   Pulse (!) 101   Temp 98.4 ?F (36.9 ?C) (Oral)   Resp 12   Wt 168 lb (76.2 kg)   SpO2 100% Comment: room air  BMI 23.43 kg/m?  ? ? ?Today's Vitals  ? 09/10/21 1607 09/10/21 1611  ?BP: (!) 145/97 (!) 157/108  ?Pulse: (!) 101   ?Resp: 12   ?Temp: 98.4 ?F (36.9 ?C)   ?TempSrc: Oral   ?SpO2: 100%   ?Weight: 168 lb (76.2 kg)   ? ?Body mass index is 23.43 kg/m?.  ? ?Physical Exam  ? ?General: Appearance:    Well developed, well nourished male in no acute distress  ?Eyes:    PERRL, conjunctiva/corneas clear, EOM's intact       ?Lungs:     Clear to auscultation bilaterally, respirations unlabored  ?Heart:  Tachycardic. Normal rhythm. No murmurs, rubs, or gallops.    ?MS:   All extremities are intact.    ?Neurologic:   Awake, alert, oriented x 3. No apparent focal neurological defect.   ?   ?  ? ?  09/10/2021  ?  4:15 PM 02/24/2021  ?  8:40 AM  ?Depression screen PHQ 2/9  ?Decreased Interest 1 1  ?Down, Depressed, Hopeless 1 1  ?PHQ - 2 Score 2 2  ?Altered sleeping 2 2  ?Tired, decreased energy 2 1  ?Change in appetite 0 1  ?Feeling bad or failure about yourself  1 1  ?Trouble concentrating 3 2  ?Moving slowly or fidgety/restless 1 0  ?Suicidal thoughts 0 0  ?PHQ-9 Score 11 9  ?Difficult doing  work/chores Somewhat difficult Not difficult at all  ? ? ? ? ? Assessment & Plan  ?  ? ?1. Primary hypertension ?Poor compliance with medications. Has only taken sporadically since last visit. Recommend he get get daily pill pack to keep track of medications.  ?- amLODipine (NORVASC) 2.5 MG tablet; Take 1 tablet (2.5 mg total) by mouth daily.  Dispense: 30 tablet; Refill: 3 ? ?2. Anxiety and depression ?start venlafaxine XR (EFFEXOR XR) 37.5 MG 24 hr capsule; Take 1 capsule (37.5 mg total) by mouth daily with breakfast.  Dispense: 30 capsule; Refill: 1 ? ?Future Appointments  ?Date Time Provider [place]  ?10/25/2021  4:00 PM Keyvin Rison, Kirstie Peri, MD [facility]  ?  ?3. Need for tetanus, diphtheria, and acellular pertussis (Tdap) vaccine in patient of adolescent age or older ? ?- Administer Tetanus-diphtheria-acellular pertussis (Tdap) vaccine   ?   ? ?The entirety of the information documented in the History of Present Illness, Review of Systems and Physical Exam were personally obtained by me. Portions of this information were initially documented by the CMA and reviewed by me for thoroughness and accuracy.   ? ? ?Lelon Huh, MD  ?[facility] ?[phone] (phone) ?[phone] (fax) ? ?[facility]  ?

## 2021-09-10 ENCOUNTER — Encounter: Payer: Self-pay | Admitting: Family Medicine

## 2021-09-10 ENCOUNTER — Ambulatory Visit: Payer: Managed Care, Other (non HMO) | Admitting: Family Medicine

## 2021-09-10 VITALS — BP 157/108 | HR 101 | Temp 98.4°F | Resp 12 | Wt 168.0 lb

## 2021-09-10 DIAGNOSIS — F419 Anxiety disorder, unspecified: Secondary | ICD-10-CM | POA: Diagnosis not present

## 2021-09-10 DIAGNOSIS — Z23 Encounter for immunization: Secondary | ICD-10-CM | POA: Diagnosis not present

## 2021-09-10 DIAGNOSIS — I10 Essential (primary) hypertension: Secondary | ICD-10-CM | POA: Diagnosis not present

## 2021-09-10 DIAGNOSIS — F32A Depression, unspecified: Secondary | ICD-10-CM | POA: Diagnosis not present

## 2021-09-10 MED ORDER — AMLODIPINE BESYLATE 2.5 MG PO TABS: 2.5000 mg | ORAL_TABLET | Freq: Every day | ORAL | 3 refills | Status: DC

## 2021-09-10 MED ORDER — VENLAFAXINE HCL ER 37.5 MG PO CP24: 37.5000 mg | ORAL_CAPSULE | Freq: Every day | ORAL | 1 refills | Status: DC

## 2021-10-25 ENCOUNTER — Ambulatory Visit: Payer: Managed Care, Other (non HMO) | Admitting: Family Medicine

## 2021-10-25 NOTE — Progress Notes (Deleted)
      I,Jana Gwendolynn Merkey,acting as a scribe for [name], MD.,have documented all relevant documentation on the behalf of [name], MD,as directed by  [name], MD while in the presence of [name], MD.  Established patient visit   Patient: Marcus Cook   DOB: [birth year]   30 y.o. Male  MRN: [id number] Visit Date: 10/25/2021  Today's healthcare provider: [name], MD   No chief complaint on file.  Subjective    HPI  Depression, Follow-up  He  was last seen for this 6 weeks ago. Changes made at last visit include start Venlafaxine XR 37.5 daily with breakfast.  He reports {excellent/good/fair/poor:19665} compliance with treatment. He {is/is not:21021397} having side effects. *** He reports {DESC; GOOD/FAIR/POOR:18685} tolerance of treatment. Current symptoms include: {Symptoms; depression:1002} He feels he is {improved/worse/unchanged:3041574} since last visit.     09/10/2021    4:15 PM 02/24/2021    8:40 AM  Depression screen PHQ 2/9  Decreased Interest 1 1  Down, Depressed, Hopeless 1 1  PHQ - 2 Score 2 2  Altered sleeping 2 2  Tired, decreased energy 2 1  Change in appetite 0 1  Feeling bad or failure about yourself  1 1  Trouble concentrating 3 2  Moving slowly or fidgety/restless 1 0  Suicidal thoughts 0 0  PHQ-9 Score 11 9  Difficult doing work/chores Somewhat difficult Not difficult at all    -----------------------------------------------------------------------------------------   Medications: Outpatient Medications Prior to Visit  Medication Sig   amLODipine (NORVASC) 2.5 MG tablet Take 1 tablet (2.5 mg total) by mouth daily.   venlafaxine XR (EFFEXOR XR) 37.5 MG 24 hr capsule Take 1 capsule (37.5 mg total) by mouth daily with breakfast.   No facility-administered medications prior to visit.    Review of Systems  {Labs  Heme  Chem  Endocrine  Serology  Results Review (optional):23779}   Objective    There were no  vitals taken for this visit. {Show previous vital signs (optional):23777}  Physical Exam  ***  No results found for any visits on 10/25/21.  Assessment & Plan     ***  No follow-ups on file.      {provider attestation***:1}   [name], MD  [facility] [phone] (phone) [phone] (fax)  [facility]

## 2021-11-06 ENCOUNTER — Other Ambulatory Visit: Payer: Self-pay | Admitting: Family Medicine

## 2021-11-06 DIAGNOSIS — F32A Depression, unspecified: Secondary | ICD-10-CM

## 2021-11-09 ENCOUNTER — Ambulatory Visit: Payer: Managed Care, Other (non HMO) | Admitting: Family Medicine

## 2021-11-09 VITALS — BP 137/107 | HR 77 | Wt 161.0 lb

## 2021-11-09 DIAGNOSIS — F419 Anxiety disorder, unspecified: Secondary | ICD-10-CM | POA: Diagnosis not present

## 2021-11-09 DIAGNOSIS — F32A Depression, unspecified: Secondary | ICD-10-CM | POA: Diagnosis not present

## 2021-11-09 DIAGNOSIS — I10 Essential (primary) hypertension: Secondary | ICD-10-CM

## 2021-11-09 MED ORDER — VENLAFAXINE HCL ER 37.5 MG PO CP24: 37.5000 mg | ORAL_CAPSULE | Freq: Every day | ORAL | 2 refills | Status: DC

## 2021-11-09 MED ORDER — AMLODIPINE BESYLATE 2.5 MG PO TABS: 2.5000 mg | ORAL_TABLET | Freq: Every day | ORAL | 2 refills | Status: DC

## 2021-11-09 MED ORDER — AMLODIPINE BESYLATE 5 MG PO TABS: 5.0000 mg | ORAL_TABLET | Freq: Every day | ORAL | 1 refills | Status: DC

## 2021-11-09 NOTE — Progress Notes (Signed)
I,Jana Robinson,acting as a scribe for Lelon Huh, MD.,have documented all relevant documentation on the behalf of Lelon Huh, MD,as directed by  Lelon Huh, MD while in the presence of Lelon Huh, MD.    Established patient visit   Patient: Marcus Cook   DOB: 1990/09/23   31 y.o. Male  MRN: [id number] Visit Date: 11/09/2021  Today's healthcare provider: Lelon Huh, MD   No chief complaint on file.  Subjective    Hypertension, follow-up  BP Readings from Last 3 Encounters:  09/10/21 (!) 157/108  04/09/21 132/89  02/24/21 (!) 145/107   Wt Readings from Last 3 Encounters:  09/10/21 168 lb (76.2 kg)  04/09/21 163 lb (73.9 kg)  02/24/21 165 lb (74.8 kg)     He was last seen for hypertension 1 months ago.  BP at that visit was 157/108. Management since that visit includes Poor compliance with medications. Has only taken sporadically since last visit. Recommend he get get daily pill pack to keep track of medications.  - amLODipine (NORVASC) 2.5 MG daily.   He reports excellent compliance with treatment. He is not having side effects.  He is following a Regular diet. He is exercising. He does not smoke.   Symptoms: Yes chest pain No chest pressure  No palpitations No syncope  No dyspnea No orthopnea  No paroxysmal nocturnal dyspnea No lower extremity edema   Pertinent labs Lab Results  Component Value Date   CHOL 155 04/09/2021   HDL 69 04/09/2021   LDLCALC 73 04/09/2021   TRIG 65 04/09/2021   CHOLHDL 2.2 04/09/2021   Lab Results  Component Value Date   NA 141 04/09/2021   K 3.8 04/09/2021   CREATININE 1.15 04/09/2021   EGFR 87 04/09/2021   GLUCOSE 96 04/09/2021     The ASCVD Risk score (Arnett DK, et al., 2019) failed to calculate for the following reasons:   The 2019 ASCVD risk score is only valid for ages 95 to 61  ---------------------------------------------------------------------------------------------------  He is also  here to follow up for anxiety on venlafaxine which he is tolerating well and feels like it is helping with anxiety level.    Medications: Outpatient Medications Prior to Visit  Medication Sig   amLODipine (NORVASC) 2.5 MG tablet Take 1 tablet (2.5 mg total) by mouth daily.   venlafaxine XR (EFFEXOR-XR) 37.5 MG 24 hr capsule TAKE 1 CAPSULE(37.5 MG) BY MOUTH DAILY WITH BREAKFAST   No facility-administered medications prior to visit.        Objective    BP (!) 137/107 (BP Location: Left Arm, Patient Position: Sitting, Cuff Size: Normal)   Pulse 77   Wt 161 lb (73 kg)   SpO2 99%   BMI 22.45 kg/m    Physical Exam  General appearance: Well developed, well nourished male, cooperative and in no acute distress Head: Normocephalic, without obvious abnormality, atraumatic Respiratory: Respirations even and unlabored, normal respiratory rate Extremities: All extremities are intact.  Skin: Skin color, texture, turgor normal. No rashes seen  Psych: Appropriate mood and affect. Neurologic: Mental status: Alert, oriented to person, place, and time, thought content appropriate.   Assessment & Plan     1. Primary hypertension Doing well with amlodipine, bp improved  - amLODipine (NORVASC) 2.5 MG tablet; Take 1 tablet (2.5 mg total) by mouth daily.  Dispense: 90 tablet; Refill: 2  2. Anxiety and depression He feels initiation of venlafaxine has been helpful with no adverse effects. He would like to continue  current dose. refill- venlafaxine XR (EFFEXOR-XR) 37.5 MG 24 hr capsule; Take 1 capsule (37.5 mg total) by mouth daily at 6 (six) AM.  Dispense: 90 capsule; Refill: 2   Future Appointments  Date Time Provider [place]  01/21/2022  3:00 PM Caryn Section Kirstie Peri, MD [facility]        The entirety of the information documented in the History of Present Illness, Review of Systems and Physical Exam were personally obtained by me. Portions of this information were initially documented  by the CMA and reviewed by me for thoroughness and accuracy.     Lelon Huh, MD  [facility] [phone] (phone) [phone] (fax)  [place]

## 2021-11-09 NOTE — Progress Notes (Deleted)
      Established patient visit   Patient: Marcus Cook   DOB: [birth year]   31 y.o. Male  MRN: [id number] Visit Date: 11/09/2021  Today's healthcare provider: Lelon Huh, MD   No chief complaint on file.  Subjective    HPI  Hypertension, follow-up  BP Readings from Last 3 Encounters:  09/10/21 (!) 157/108  04/09/21 132/89  02/24/21 (!) 145/107   Wt Readings from Last 3 Encounters:  09/10/21 168 lb (76.2 kg)  04/09/21 163 lb (73.9 kg)  02/24/21 165 lb (74.8 kg)     He was last seen for hypertension 2 months ago.  BP at that visit was as above. Management since that visit includes none.  He reports good compliance with treatment.  Possibly missed 1 r 2 doses. He is not having side effects.  He is following a Regular diet. He is exercising. He does smoke.  Use of agents associated with hypertension: Patient asdmits to drinking energy drinks.   Outside blood pressures are not being checked. Symptoms: No chest pain No chest pressure  No palpitations No syncope  No dyspnea No orthopnea  No paroxysmal nocturnal dyspnea No lower extremity edema   Pertinent labs Lab Results  Component Value Date   CHOL 155 04/09/2021   HDL 69 04/09/2021   LDLCALC 73 04/09/2021   TRIG 65 04/09/2021   CHOLHDL 2.2 04/09/2021   Lab Results  Component Value Date   NA 141 04/09/2021   K 3.8 04/09/2021   CREATININE 1.15 04/09/2021   EGFR 87 04/09/2021   GLUCOSE 96 04/09/2021     The ASCVD Risk score (Arnett DK, et al., 2019) failed to calculate for the following reasons:   The 2019 ASCVD risk score is only valid for ages 73 to 96  ---------------------------------------------------------------------------------------------------   Medications: Outpatient Medications Prior to Visit  Medication Sig   amLODipine (NORVASC) 2.5 MG tablet Take 1 tablet (2.5 mg total) by mouth daily.   venlafaxine XR (EFFEXOR-XR) 37.5 MG 24 hr capsule TAKE 1 CAPSULE(37.5 MG) BY MOUTH DAILY  WITH BREAKFAST   No facility-administered medications prior to visit.    Review of Systems  {Labs  Heme  Chem  Endocrine  Serology  Results Review (optional):23779}   Objective    There were no vitals taken for this visit. Vitals:   11/09/21 1625 11/09/21 1626  BP: (!) 141/100 (!) 137/107  Pulse: 77   SpO2: 99%   Weight: 161 lb (73 kg)      Physical Exam  ***  No results found for any visits on 11/09/21.  Assessment & Plan     ***  No follow-ups on file.      {provider attestation***:1}   Lelon Huh, MD  [facility] [phone] (phone) [phone] (fax)  [place]

## 2022-01-20 NOTE — Progress Notes (Signed)
Established patient visit  I,Marcus Cook,acting as a scribe for Marcus Huh, MD.,have documented all relevant documentation on the behalf of Marcus Huh, MD,as directed by  Marcus Huh, MD while in the presence of Marcus Huh, MD.   Patient: Marcus Cook   DOB: January 05, 1991   31 y.o. Male  MRN: [id number] Visit Date: 01/21/2022  Today's healthcare provider: Lelon Huh, MD   Chief Complaint  Patient presents with   Follow-up   Hypertension   Subjective    HPI  Hypertension, follow-up  BP Readings from Last 3 Encounters:  01/21/22 (!) 140/94  11/09/21 (!) 137/107  09/10/21 (!) 157/108   Wt Readings from Last 3 Encounters:  01/21/22 164 lb (74.4 kg)  11/09/21 161 lb (73 kg)  09/10/21 168 lb (76.2 kg)     He was last seen for hypertension on 11/09/2021.   BP at that visit was 137/107. Management since that visit includes; increasing amlodipine from 2.$RemoveBeforeDEI'5mg'poBcrmoDOdwWTuvf$  daily to $Remove'5mg'AKLLwIv$  daily.  He reports good compliance with treatment. He is not having side effects. none He is following a Regular diet. He is exercising. He does not smoke.  Use of agents associated with hypertension: none.   Outside blood pressures are not checking.  Pertinent labs Lab Results  Component Value Date   CHOL 155 04/09/2021   HDL 69 04/09/2021   LDLCALC 73 04/09/2021   TRIG 65 04/09/2021   CHOLHDL 2.2 04/09/2021   Lab Results  Component Value Date   NA 141 04/09/2021   K 3.8 04/09/2021   CREATININE 1.15 04/09/2021   EGFR 87 04/09/2021   GLUCOSE 96 04/09/2021     The ASCVD Risk score (Arnett DK, et al., 2019) failed to calculate for the following reasons:   The 2019 ASCVD risk score is only valid for ages 73 to 82  ---------------------------------------------------------------------------------------------------   Medications: Outpatient Medications Prior to Visit  Medication Sig   amLODipine (NORVASC) 5 MG tablet Take 1 tablet (5 mg total) by mouth daily.    venlafaxine XR (EFFEXOR-XR) 37.5 MG 24 hr capsule Take 1 capsule (37.5 mg total) by mouth daily at 6 (six) AM.   No facility-administered medications prior to visit.   ROS      Objective    BP (!) 140/94 (BP Location: Right Arm, Cuff Size: Normal)   Pulse 77   Resp 16   Wt 164 lb (74.4 kg)   SpO2 100%   BMI 22.87 kg/m    Physical Exam   General appearance: Well developed, well nourished male, cooperative and in no acute distress Head: Normocephalic, without obvious abnormality, atraumatic Respiratory: Respirations even and unlabored, normal respiratory rate Extremities: All extremities are intact.  Skin: Skin color, texture, turgor normal. No rashes seen  Psych: Appropriate mood and affect. Neurologic: Mental status: Alert, oriented to person, place, and time, thought content appropriate.    Assessment & Plan     1. Primary hypertension Tolerating increase dose of amlodipine with slight improvement. Discussed increasing dose versus adding second medication. Will opt to double amLODipine (NORVASC) 5 MG tablet; to 2 tablets (10 mg total) by mouth daily, will change to $RemoveB'10mg'DqNcFTah$  tabs with next refill.   Considering family history and relatively low potassium level would consider adding MCRA if not controlled at follow up.      The entirety of the information documented in the History of Present Illness, Review of Systems and Physical Exam were personally obtained by me. Portions of this information were initially  documented by the CMA and reviewed by me for thoroughness and accuracy.     Marcus Huh, MD  [facility] [phone] (phone) [phone] (fax)  [place]

## 2022-01-21 ENCOUNTER — Ambulatory Visit: Payer: Managed Care, Other (non HMO) | Admitting: Family Medicine

## 2022-01-21 ENCOUNTER — Encounter: Payer: Self-pay | Admitting: Family Medicine

## 2022-01-21 VITALS — BP 140/94 | HR 77 | Resp 16 | Wt 164.0 lb

## 2022-01-21 DIAGNOSIS — I10 Essential (primary) hypertension: Secondary | ICD-10-CM | POA: Diagnosis not present

## 2022-01-21 MED ORDER — AMLODIPINE BESYLATE 5 MG PO TABS: 10.0000 mg | ORAL_TABLET | Freq: Every day | ORAL | Status: DC

## 2022-01-21 NOTE — Patient Instructions (Signed)
.   Please review the attached list of medications and notify my office if there are any errors.   . Please bring all of your medications to every appointment so we can make sure that our medication list is the same as yours.   

## 2022-01-25 ENCOUNTER — Other Ambulatory Visit: Payer: Self-pay | Admitting: Family Medicine

## 2022-01-25 DIAGNOSIS — I10 Essential (primary) hypertension: Secondary | ICD-10-CM

## 2022-01-25 MED ORDER — AMLODIPINE BESYLATE 10 MG PO TABS: 10.0000 mg | ORAL_TABLET | Freq: Every day | ORAL | 3 refills | Status: DC

## 2022-04-22 ENCOUNTER — Ambulatory Visit: Payer: Managed Care, Other (non HMO) | Admitting: Family Medicine

## 2022-05-04 NOTE — Progress Notes (Signed)
I,[name],acting as a scribe for Lelon Huh, MD.,have documented all relevant documentation on the behalf of Lelon Huh, MD,as directed by  Lelon Huh, MD while in the presence of Lelon Huh, MD.   Established patient visit   Patient: Marcus Cook   DOB: 20-Apr-1991   31 y.o. Male  MRN: [id number] Visit Date: 05/06/2022  Today's healthcare provider: Lelon Huh, MD   Chief Complaint  Patient presents with   Hypertension   Subjective    HPI  Hypertension, follow-up  BP Readings from Last 3 Encounters:  05/06/22 (!) 139/90  01/21/22 (!) 140/94  11/09/21 (!) 137/107   Wt Readings from Last 3 Encounters:  05/06/22 172 lb (78 kg)  01/21/22 164 lb (74.4 kg)  11/09/21 161 lb (73 kg)     He was last seen for hypertension 3 months ago.  Management since that visit includes; increased amlodipine to 5 mg bid, which was subsequently change to 11m daily.   He reports excellent compliance with treatment. He is not having side effects.  He is following a Regular diet. He is exercising. He does not smoke.  Use of agents associated with hypertension: none.   Outside blood pressures are not checked regularly.  Pertinent labs Lab Results  Component Value Date   CHOL 155 04/09/2021   HDL 69 04/09/2021   LDLCALC 73 04/09/2021   TRIG 65 04/09/2021   CHOLHDL 2.2 04/09/2021   Lab Results  Component Value Date   NA 141 04/09/2021   K 3.8 04/09/2021   CREATININE 1.15 04/09/2021   EGFR 87 04/09/2021   GLUCOSE 96 04/09/2021     The ASCVD Risk score (Arnett DK, et al., 2019) failed to calculate for the following reasons:   The 2019 ASCVD risk score is only valid for ages 439to 764 ---------------------------------------------------------------------------------------------------   Medications: Outpatient Medications Prior to Visit  Medication Sig   amLODipine (NORVASC) 10 MG tablet Take 1 tablet (10 mg total) by mouth daily.   venlafaxine XR  (EFFEXOR-XR) 37.5 MG 24 hr capsule Take 1 capsule (37.5 mg total) by mouth daily at 6 (six) AM.   No facility-administered medications prior to visit.    Review of Systems  Constitutional:  Negative for appetite change, chills and fever.  Respiratory:  Negative for chest tightness, shortness of breath and wheezing.   Cardiovascular:  Negative for chest pain and palpitations.  Gastrointestinal:  Negative for abdominal pain, nausea and vomiting.       Objective    BP (!) 139/90 (BP Location: Left Arm, Patient Position: Sitting, Cuff Size: Normal)   Pulse 92   Resp 16   Ht _0  (1.778 m)   Wt 172 lb (78 kg)   SpO2 98%   BMI 24.68 kg/m    Physical Exam   General appearance: Well developed, well nourished male, cooperative and in no acute distress Head: Normocephalic, without obvious abnormality, atraumatic Respiratory: Respirations even and unlabored, normal respiratory rate Extremities: All extremities are intact.  Skin: Skin color, texture, turgor normal. No rashes seen  Psych: Appropriate mood and affect. Neurologic: Mental status: Alert, oriented to person, place, and time, thought content appropriate.   Assessment & Plan     1. Primary hypertension Fairly well controlled on current medications.  Follow up 6 months.      The entirety of the information documented in the History of Present Illness, Review of Systems and Physical Exam were personally obtained by me. Portions of  this information were initially documented by the CMA and reviewed by me for thoroughness and accuracy.     Lelon Huh, MD  [facility] [phone] (phone) [phone] (fax)  [place]

## 2022-05-06 ENCOUNTER — Encounter: Payer: Self-pay | Admitting: Family Medicine

## 2022-05-06 ENCOUNTER — Ambulatory Visit: Payer: Managed Care, Other (non HMO) | Admitting: Family Medicine

## 2022-05-06 VITALS — BP 130/88 | HR 92 | Resp 16 | Ht 70.0 in | Wt 172.0 lb

## 2022-05-06 DIAGNOSIS — I10 Essential (primary) hypertension: Secondary | ICD-10-CM

## 2022-08-07 ENCOUNTER — Other Ambulatory Visit: Payer: Self-pay | Admitting: Family Medicine

## 2022-08-07 DIAGNOSIS — F419 Anxiety disorder, unspecified: Secondary | ICD-10-CM

## 2022-08-08 NOTE — Telephone Encounter (Signed)
Requested medication (s) are due for refill today: yes  Requested medication (s) are on the active medication list: yes  Last refill:  11/09/21 #90/2  Future visit scheduled: yes  Notes to clinic:  Unable to refill per protocol due to failed labs, no updated results.     Requested Prescriptions  Pending Prescriptions Disp Refills   venlafaxine XR (EFFEXOR-XR) 37.5 MG 24 hr capsule [Pharmacy Med Name: VENLAFAXINE ER 37.'5MG'$  CAPSULES] 90 capsule 2    Sig: TAKE 1 CAPSULE(37.5 MG) BY MOUTH DAILY AT 6 AM     Psychiatry: Antidepressants - SNRI - desvenlafaxine & venlafaxine Failed - 08/07/2022 10:15 AM      Failed - Cr in normal range and within 360 days    Creatinine  Date Value Ref Range Status  07/11/2011 1.06 0.60 - 1.30 mg/dL Final   Creatinine, Ser  Date Value Ref Range Status  04/09/2021 1.15 0.76 - 1.27 mg/dL Final         Failed - Lipid Panel in normal range within the last 12 months    Cholesterol, Total  Date Value Ref Range Status  04/09/2021 155 100 - 199 mg/dL Final   LDL Chol Calc (NIH)  Date Value Ref Range Status  04/09/2021 73 0 - 99 mg/dL Final   HDL  Date Value Ref Range Status  04/09/2021 69 >39 mg/dL Final   Triglycerides  Date Value Ref Range Status  04/09/2021 65 0 - 149 mg/dL Final         Passed - Last BP in normal range    BP Readings from Last 1 Encounters:  05/06/22 130/88         Passed - Valid encounter within last 6 months    Recent Outpatient Visits           3 months ago Primary hypertension   [place], Donald E, MD   6 months ago Primary hypertension   [place], Donald E, MD   9 months ago Primary hypertension   [place], Donald E, MD   11 months ago Primary hypertension   [place], Donald E, MD   1 year ago Primary hypertension   [place], Donald  E, MD       Future Appointments             In 3 months Fisher, Kirstie Peri, MD [facility] [place]

## 2022-11-11 ENCOUNTER — Ambulatory Visit: Payer: Managed Care, Other (non HMO) | Admitting: Family Medicine

## 2022-11-11 VITALS — BP 155/102 | HR 96 | Ht 71.0 in | Wt 183.3 lb

## 2022-11-11 DIAGNOSIS — F419 Anxiety disorder, unspecified: Secondary | ICD-10-CM

## 2022-11-11 DIAGNOSIS — F32A Depression, unspecified: Secondary | ICD-10-CM

## 2022-11-11 DIAGNOSIS — I10 Essential (primary) hypertension: Secondary | ICD-10-CM | POA: Diagnosis not present

## 2022-11-11 MED ORDER — VENLAFAXINE HCL ER 75 MG PO CP24: ORAL_CAPSULE | ORAL | 1 refills | Status: DC

## 2022-11-11 MED ORDER — AMLODIPINE BESYLATE 10 MG PO TABS: 10.0000 mg | ORAL_TABLET | Freq: Every day | ORAL | 1 refills | Status: DC

## 2022-11-11 MED ORDER — VENLAFAXINE HCL ER 75 MG PO CP24: 75.0000 mg | ORAL_CAPSULE | Freq: Every day | ORAL | 1 refills | Status: DC

## 2022-11-11 MED ORDER — VALSARTAN 80 MG PO TABS: 80.0000 mg | ORAL_TABLET | Freq: Every day | ORAL | 1 refills | Status: DC

## 2022-11-11 NOTE — Progress Notes (Signed)
I,[name],acting as a Neurosurgeon for [name], MD.,have documented all relevant documentation on the behalf of [name], MD,as directed by  [name], MD   Established patient visit   Patient: Marcus Cook   DOB: 03-Sep-1990   32 y.o. Male  MRN: [id number] Visit Date: 11/11/2022  Today's healthcare provider: [name], MD    Subjective    HPI  Hypertension, follow-up  BP Readings from Last 3 Encounters:  05/06/22 130/88  01/21/22 (!) 140/94  11/09/21 (!) 137/107   Wt Readings from Last 3 Encounters:  05/06/22 172 lb (78 kg)  01/21/22 164 lb (74.4 kg)  11/09/21 161 lb (73 kg)     He was last seen for hypertension 6 months ago.  BP at that visit was 130/88. Management since that visit includes continue current treatment.  Outside blood pressures are checked on occasions. Symptoms: No chest pain No chest pressure  No palpitations No syncope  No dyspnea No orthopnea  No paroxysmal nocturnal dyspnea No lower extremity edema   Pertinent labs Lab Results  Component Value Date   CHOL 155 04/09/2021   HDL 69 04/09/2021   LDLCALC 73 04/09/2021   TRIG 65 04/09/2021   CHOLHDL 2.2 04/09/2021   Lab Results  Component Value Date   NA 141 04/09/2021   K 3.8 04/09/2021   CREATININE 1.15 04/09/2021   EGFR 87 04/09/2021   GLUCOSE 96 04/09/2021     The ASCVD Risk score (Arnett DK, et al., 2019) failed to calculate for the following reasons:   The 2019 ASCVD risk score is only valid for ages 67 to 30 ---------------------------------------------------------------------------------------------------   Medications: Outpatient Medications Prior to Visit  Medication Sig   amLODipine (NORVASC) 10 MG tablet Take 1 tablet (10 mg total) by mouth daily.   venlafaxine XR (EFFEXOR-XR) 37.5 MG 24 hr capsule TAKE 1 CAPSULE(37.5 MG) BY MOUTH DAILY AT 6 AM   No facility-administered medications prior to visit.        Objective    BP (!) 155/102 (BP  Location: Right Arm, Patient Position: Sitting, Cuff Size: Normal)   Pulse 96   Ht 5\' 11"  (1.803 m)   Wt 183 lb 4.8 oz (83.1 kg)   BMI 25.57 kg/m    Physical Exam   General: Appearance:    Well developed, well nourished male in no acute distress  Eyes:    PERRL, conjunctiva/corneas clear, EOM's intact       Lungs:     Clear to auscultation bilaterally, respirations unlabored  Heart:    Normal heart rate. Normal rhythm. No murmurs, rubs, or gallops.    MS:   All extremities are intact.    Neurologic:   Awake, alert, oriented x 3. No apparent focal neurological defect.         Assessment & Plan     1. Primary hypertension Not too goal. Compliant with medications. Encouraged salt reduction.   add valsartan (DIOVAN) 80 MG tablet; Take 1 tablet (80 mg total) by mouth daily.  Dispense: 30 tablet; Refill: 1  refill amLODipine (NORVASC) 10 MG tablet; Take 1 tablet (10 mg total) by mouth daily.  Dispense: 90 tablet; Refill: 1  Follow up 1 month  2. Anxiety and depression He reports venlafaxine is effective and having no side effects, but feels he needs to increase dose.   Increase to venlafaxine XR (EFFEXOR-XR) 75 MG 24 hr capsule; TAKE 1 CAPSULE(37.5 MG) BY MOUTH DAILY AT 6 AM  Dispense:  90 capsule; Refill: 1      The entirety of the information documented in the History of Present Illness, Review of Systems and Physical Exam were personally obtained by me. Portions of this information were initially documented by the CMA and reviewed by me for thoroughness and accuracy.     [name], MD  [facility] [phone] (phone) [phone] (fax)  [facility]

## 2022-11-11 NOTE — Patient Instructions (Signed)
.   Please review the attached list of medications and notify my office if there are any errors.   . Please bring all of your medications to every appointment so we can make sure that our medication list is the same as yours.   

## 2022-12-18 ENCOUNTER — Other Ambulatory Visit: Payer: Self-pay | Admitting: Family Medicine

## 2022-12-18 DIAGNOSIS — I10 Essential (primary) hypertension: Secondary | ICD-10-CM

## 2022-12-23 ENCOUNTER — Encounter: Payer: Self-pay | Admitting: Family Medicine

## 2022-12-23 ENCOUNTER — Ambulatory Visit: Payer: Managed Care, Other (non HMO) | Admitting: Family Medicine

## 2022-12-23 VITALS — BP 136/88 | HR 85 | Temp 98.3°F | Resp 12 | Ht 71.0 in | Wt 185.5 lb

## 2022-12-23 DIAGNOSIS — I10 Essential (primary) hypertension: Secondary | ICD-10-CM | POA: Diagnosis not present

## 2022-12-23 DIAGNOSIS — F32A Depression, unspecified: Secondary | ICD-10-CM

## 2022-12-23 DIAGNOSIS — F419 Anxiety disorder, unspecified: Secondary | ICD-10-CM

## 2022-12-23 DIAGNOSIS — Z1159 Encounter for screening for other viral diseases: Secondary | ICD-10-CM

## 2022-12-24 LAB — CBC
Hematocrit: 44.5 % (ref 37.5–51.0)
Hemoglobin: 15.1 g/dL (ref 13.0–17.7)
MCH: 28.1 pg (ref 26.6–33.0)
MCHC: 33.9 g/dL (ref 31.5–35.7)
MCV: 83 fL (ref 79–97)
Platelets: 247 10*3/uL (ref 150–450)
RBC: 5.37 x10E6/uL (ref 4.14–5.80)
RDW: 13.1 % (ref 11.6–15.4)
WBC: 4.9 10*3/uL (ref 3.4–10.8)

## 2022-12-24 LAB — COMPREHENSIVE METABOLIC PANEL
ALT: 33 IU/L (ref 0–44)
AST: 20 IU/L (ref 0–40)
Albumin: 4.5 g/dL (ref 4.1–5.1)
Alkaline Phosphatase: 64 IU/L (ref 44–121)
BUN/Creatinine Ratio: 8 — ABNORMAL LOW (ref 9–20)
BUN: 8 mg/dL (ref 6–20)
Bilirubin Total: 0.8 mg/dL (ref 0.0–1.2)
CO2: 22 mmol/L (ref 20–29)
Calcium: 8.9 mg/dL (ref 8.7–10.2)
Chloride: 102 mmol/L (ref 96–106)
Creatinine, Ser: 1.04 mg/dL (ref 0.76–1.27)
Globulin, Total: 2.2 g/dL (ref 1.5–4.5)
Glucose: 87 mg/dL (ref 70–99)
Potassium: 3.8 mmol/L (ref 3.5–5.2)
Sodium: 141 mmol/L (ref 134–144)
Total Protein: 6.7 g/dL (ref 6.0–8.5)
eGFR: 97 mL/min/{1.73_m2} (ref >59)

## 2022-12-24 LAB — LIPID PANEL
Chol/HDL Ratio: 2.5 ratio (ref 0.0–5.0)
Cholesterol, Total: 165 mg/dL (ref 100–199)
HDL: 67 mg/dL (ref >39)
LDL Chol Calc (NIH): 84 mg/dL (ref 0–99)
Triglycerides: 71 mg/dL (ref 0–149)
VLDL Cholesterol Cal: 14 mg/dL (ref 5–40)

## 2022-12-24 LAB — HEPATITIS C ANTIBODY: Hep C Virus Ab: NONREACTIVE

## 2022-12-27 NOTE — Progress Notes (Signed)
      Established patient visit   Patient: Marcus Cook   DOB: 03-03-1991   32 y.o. Male  MRN: [id number] Visit Date: 12/23/2022  Today's healthcare provider: [name], MD   Chief Complaint  Patient presents with   Hypertension   Depression   Subjective    HPI HPI   Patient reports good compliance and tolerance of medications.  Patient reports depression is mild and unchanged.  Last edited by Myles Lipps, CMA on 12/23/2022  3:40 PM.      He feels venlafaxine is working well, having no adverse effects, and wishes to continue the same dose. Continues on amlodipine and valsartan for blood pressure which he reports are well tolerating, and is generally feeling well.   Medications: Outpatient Medications Prior to Visit  Medication Sig   amLODipine (NORVASC) 10 MG tablet Take 1 tablet (10 mg total) by mouth daily.   valsartan (DIOVAN) 80 MG tablet TAKE 1 TABLET(80 MG) BY MOUTH DAILY   venlafaxine XR (EFFEXOR-XR) 75 MG 24 hr capsule Take 1 capsule (75 mg total) by mouth daily.   No facility-administered medications prior to visit.       Objective    BP 136/88 (BP Location: Left Arm, Patient Position: Sitting, Cuff Size: Normal)   Pulse 85   Temp 98.3 F (36.8 C) (Temporal)   Resp 12   Ht 5\' 11"  (1.803 m)   Wt 185 lb 8 oz (84.1 kg)   SpO2 99%   BMI 25.87 kg/m   Physical Exam    General: Appearance:    Well developed, well nourished male in no acute distress  Eyes:    PERRL, conjunctiva/corneas clear, EOM's intact       Lungs:     Clear to auscultation bilaterally, respirations unlabored  Heart:    Normal heart rate. Normal rhythm. No murmurs, rubs, or gallops.    MS:   All extremities are intact.    Neurologic:   Awake, alert, oriented x 3. No apparent focal neurological defect.         Assessment & Plan     1. Primary hypertension BP near goal. Continue current medications.   - CBC - Comprehensive metabolic panel - Lipid panel  2.  Anxiety and depression Continue current dose of venlafaxine.   3. Need for hepatitis C screening test  - Hepatitis C antibody        [name], MD  [facility] [phone] (phone) [phone] (fax)  [facility]

## 2023-03-12 ENCOUNTER — Other Ambulatory Visit: Payer: Self-pay | Admitting: Family Medicine

## 2023-03-12 DIAGNOSIS — I10 Essential (primary) hypertension: Secondary | ICD-10-CM

## 2023-03-13 NOTE — Telephone Encounter (Signed)
Requested Prescriptions  Pending Prescriptions Disp Refills   valsartan (DIOVAN) 80 MG tablet [Pharmacy Med Name: VALSARTAN 80MG  TABLETS] 90 tablet 1    Sig: TAKE 1 TABLET(80 MG) BY MOUTH DAILY     Cardiovascular:  Angiotensin Receptor Blockers Passed - 03/12/2023 11:19 AM      Passed - Cr in normal range and within 180 days    Creatinine  Date Value Ref Range Status  07/11/2011 1.06 0.60 - 1.30 mg/dL Final   Creatinine, Ser  Date Value Ref Range Status  12/23/2022 1.04 0.76 - 1.27 mg/dL Final         Passed - K in normal range and within 180 days    Potassium  Date Value Ref Range Status  12/23/2022 3.8 3.5 - 5.2 mmol/L Final  07/11/2011 3.5 3.5 - 5.1 mmol/L Final         Passed - Patient is not pregnant      Passed - Last BP in normal range    BP Readings from Last 1 Encounters:  12/23/22 136/88         Passed - Valid encounter within last 6 months    Recent Outpatient Visits           2 months ago Primary hypertension   [place] [facility] Malva Limes, MD   4 months ago Primary hypertension   [place] [facility] Malva Limes, MD   10 months ago Primary hypertension   [place] [facility] Malva Limes, MD   1 year ago Primary hypertension   [place] [facility] Malva Limes, MD   1 year ago Primary hypertension   [place] [facility] Malva Limes, MD

## 2023-05-11 ENCOUNTER — Other Ambulatory Visit: Payer: Self-pay | Admitting: Family Medicine

## 2023-05-11 DIAGNOSIS — F32A Depression, unspecified: Secondary | ICD-10-CM

## 2023-05-12 NOTE — Telephone Encounter (Signed)
Please advise 

## 2023-11-10 ENCOUNTER — Other Ambulatory Visit: Payer: Self-pay | Admitting: Family Medicine

## 2023-11-10 DIAGNOSIS — F419 Anxiety disorder, unspecified: Secondary | ICD-10-CM

## 2023-11-10 NOTE — Telephone Encounter (Signed)
 Requested medications are due for refill today.  yes  Requested medications are on the active medications list.  yes  Last refill. 05/12/2023 #90 1 rf  Future visit scheduled.   no  Notes to clinic.  Pt last seen 12/23/2022 - pt is more than 3 months overdue for an ov.    Requested Prescriptions  Pending Prescriptions Disp Refills   venlafaxine  XR (EFFEXOR -XR) 75 MG 24 hr capsule 90 capsule 1     Psychiatry: Antidepressants - SNRI - desvenlafaxine & venlafaxine  Failed - 11/10/2023  4:58 PM      Failed - Valid encounter within last 6 months    Recent Outpatient Visits   None            Failed - Lipid Panel in normal range within the last 12 months    Cholesterol, Total  Date Value Ref Range Status  12/23/2022 165 100 - 199 mg/dL Final   LDL Chol Calc (NIH)  Date Value Ref Range Status  12/23/2022 84 0 - 99 mg/dL Final   HDL  Date Value Ref Range Status  12/23/2022 67 >39 mg/dL Final   Triglycerides  Date Value Ref Range Status  12/23/2022 71 0 - 149 mg/dL Final         Passed - Cr in normal range and within 360 days    Creatinine  Date Value Ref Range Status  07/11/2011 1.06 0.60 - 1.30 mg/dL Final   Creatinine, Ser  Date Value Ref Range Status  12/23/2022 1.04 0.76 - 1.27 mg/dL Final         Passed - Last BP in normal range    BP Readings from Last 1 Encounters:  12/23/22 136/88

## 2023-11-10 NOTE — Telephone Encounter (Signed)
 Copied from CRM [id number]. Topic: Clinical - Medication Refill >> Nov 10, 2023 10:00 AM Fredrica W wrote: Medication: venlafaxine  XR (EFFEXOR -XR) 75 MG 24 hr capsule  Has the patient contacted their pharmacy? Yes (Agent: If no, request that the patient contact the pharmacy for the refill. If patient does not wish to contact the pharmacy document the reason why and proceed with request.) (Agent: If yes, when and what did the pharmacy advise?) Need provider approval   This is the patient's preferred pharmacy:  [facility][id number] [place], [place] - [address] AT [facility] OF [address] [place] [address] Phone: [phone] Fax: [phone]  Is this the correct pharmacy for this prescription? Yes If no, delete pharmacy and type the correct one.   Has the prescription been filled recently? No  Is the patient out of the medication? No - 5 left   Has the patient been seen for an appointment in the last year OR does the patient have an upcoming appointment? Yes last OV 12/2022  Can we respond through MyChart? Yes  Agent: Please be advised that Rx refills may take up to 3 business days. We ask that you follow-up with your pharmacy.

## 2023-11-12 MED ORDER — VENLAFAXINE HCL ER 75 MG PO CP24: 75.0000 mg | ORAL_CAPSULE | Freq: Every day | ORAL | 0 refills | Status: DC

## 2024-02-12 ENCOUNTER — Telehealth: Payer: Self-pay | Admitting: Family Medicine

## 2024-02-12 NOTE — Telephone Encounter (Unsigned)
 Copied from CRM #[id number]. Topic: Clinical - Medication Refill >> Feb 12, 2024  9:25 AM [name] wrote: Medication: venlafaxine  XR (EFFEXOR -XR) 75 MG 24 hr capsule [[id number]]  Has the patient contacted their pharmacy? Yes (Agent: If no, request that the patient contact the pharmacy for the refill. If patient does not wish to contact the pharmacy document the reason why and proceed with request.) (Agent: If yes, when and what did the pharmacy advise?)  This is the patient's preferred pharmacy:  [facility] #[id number] [place], [place] - [address] AT [facility] OF [address] [place] [address] Phone: [phone] Fax: [phone]  Is this the correct pharmacy for this prescription? Yes If no, delete pharmacy and type the correct one.   Has the prescription been filled recently? Yes  Is the patient out of the medication? Yes  Has the patient been seen for an appointment in the last year OR does the patient have an upcoming appointment? Yes  Can we respond through MyChart? No  Agent: Please be advised that Rx refills may take up to 3 business days. We ask that you follow-up with your pharmacy.

## 2024-02-12 NOTE — Telephone Encounter (Signed)
 Attempted to contact pt to inform him that we received his refill request of Effexor -XR but last refill request for med sent on 6/6 was denied due to needing an appt, no future appt scheduled at this time. No answer, Voicemail box that has not been set up yet.

## 2024-02-14 ENCOUNTER — Other Ambulatory Visit: Payer: Self-pay | Admitting: Family Medicine

## 2024-02-14 DIAGNOSIS — F32A Depression, unspecified: Secondary | ICD-10-CM

## 2024-02-14 NOTE — Telephone Encounter (Signed)
 Requested medication (s) are due for refill today:   Yes  Requested medication (s) are on the active medication list:   Yes  Future visit scheduled:   Yes 10/13 with Dr. Gasper.    Pt requesting enough medication to last until his appt.   He is out.  Please call pt.   Last ordered: 11/12/2023 #90, 0 refills  Unable to refill because labs and an OV are due.    Has upcoming appt.  Requested Prescriptions  Pending Prescriptions Disp Refills   venlafaxine  XR (EFFEXOR -XR) 75 MG 24 hr capsule 90 capsule 0    Sig: Take 1 capsule (75 mg total) by mouth daily.     Psychiatry: Antidepressants - SNRI - desvenlafaxine & venlafaxine  Failed - 02/14/2024 10:57 AM      Failed - Cr in normal range and within 360 days    Creatinine  Date Value Ref Range Status  07/11/2011 1.06 0.60 - 1.30 mg/dL Final   Creatinine, Ser  Date Value Ref Range Status  12/23/2022 1.04 0.76 - 1.27 mg/dL Final         Failed - Valid encounter within last 6 months    Recent Outpatient Visits   None            Failed - Lipid Panel in normal range within the last 12 months    Cholesterol, Total  Date Value Ref Range Status  12/23/2022 165 100 - 199 mg/dL Final   LDL Chol Calc (NIH)  Date Value Ref Range Status  12/23/2022 84 0 - 99 mg/dL Final   HDL  Date Value Ref Range Status  12/23/2022 67 >39 mg/dL Final   Triglycerides  Date Value Ref Range Status  12/23/2022 71 0 - 149 mg/dL Final         Passed - Last BP in normal range    BP Readings from Last 1 Encounters:  12/23/22 136/88

## 2024-02-14 NOTE — Telephone Encounter (Signed)
 Copied from CRM #[id number]. Topic: Clinical - Medication Refill >> Feb 12, 2024  9:25 AM [name] wrote: Medication: venlafaxine  XR (EFFEXOR -XR) 75 MG 24 hr capsule [[id number]]  Has the patient contacted their pharmacy? Yes (Agent: If no, request that the patient contact the pharmacy for the refill. If patient does not wish to contact the pharmacy document the reason why and proceed with request.) (Agent: If yes, when and what did the pharmacy advise?)  This is the patient's preferred pharmacy:  [facility] #[id number] [place], [place] - [address] AT [facility] OF [address] [place] [address] Phone: [phone] Fax: [phone]  Is this the correct pharmacy for this prescription? Yes If no, delete pharmacy and type the correct one.   Has the prescription been filled recently? Yes  Is the patient out of the medication? Yes  Has the patient been seen for an appointment in the last year OR does the patient have an upcoming appointment? Yes  Can we respond through MyChart? No  Agent: Please be advised that Rx refills may take up to 3 business days. We ask that you follow-up with your pharmacy. >> Feb 14, 2024 10:46 AM Marcus Cook POUR wrote: I made Marcus Cook an appointment for Oct. 13. He wants to see if Dr. Gasper will give him enough medication to last him until Oct. 13. He is out of the medication. Please call him back at [phone] ([phone]  to let him know if this can be done. Thanks

## 2024-02-15 ENCOUNTER — Other Ambulatory Visit: Payer: Self-pay | Admitting: Family Medicine

## 2024-02-15 DIAGNOSIS — F419 Anxiety disorder, unspecified: Secondary | ICD-10-CM

## 2024-03-18 ENCOUNTER — Encounter: Payer: Self-pay | Admitting: Family Medicine

## 2024-03-18 ENCOUNTER — Ambulatory Visit (INDEPENDENT_AMBULATORY_CARE_PROVIDER_SITE_OTHER): Admitting: Family Medicine

## 2024-03-18 VITALS — BP 133/106 | HR 89 | Resp 16 | Ht 71.0 in | Wt 193.2 lb

## 2024-03-18 DIAGNOSIS — I10 Essential (primary) hypertension: Secondary | ICD-10-CM

## 2024-03-18 DIAGNOSIS — Z0001 Encounter for general adult medical examination with abnormal findings: Secondary | ICD-10-CM

## 2024-03-18 DIAGNOSIS — Z0000 Encounter for general adult medical examination without abnormal findings: Secondary | ICD-10-CM

## 2024-03-18 DIAGNOSIS — F419 Anxiety disorder, unspecified: Secondary | ICD-10-CM | POA: Diagnosis not present

## 2024-03-18 DIAGNOSIS — Z23 Encounter for immunization: Secondary | ICD-10-CM | POA: Diagnosis not present

## 2024-03-18 DIAGNOSIS — F32A Depression, unspecified: Secondary | ICD-10-CM | POA: Diagnosis not present

## 2024-03-18 MED ORDER — VENLAFAXINE HCL ER 75 MG PO CP24: 75.0000 mg | ORAL_CAPSULE | Freq: Every day | ORAL | 0 refills | Status: DC

## 2024-03-18 MED ORDER — AMLODIPINE BESYLATE 10 MG PO TABS: ORAL_TABLET | ORAL | 1 refills | Status: AC

## 2024-03-18 NOTE — Patient Instructions (Signed)
 SABRA  Please review the attached list of medications and notify my office if there are any errors.   . Please bring all of your medications to every appointment so we can make sure that our medication list is the same as yours.

## 2024-03-18 NOTE — Progress Notes (Signed)
 Complete physical exam   Patient: Marcus Cook   DOB: [birth year]   33 y.o. Male  MRN: [id number] Visit Date: 03/18/2024  Today's healthcare provider: Nancyann Perry, MD   Chief Complaint  Patient presents with   Annual Exam    Patient reports feeling well. He is exercising. He is sleeping fairly well.   Hypertension    Patient reports that it has been 4-5 months without medication.   Subjective    Discussed the use of AI scribe software for clinical note transcription with the patient, who gave verbal consent to proceed.  History of Present Illness   Marcus Cook is a 33 year old male who presents for an annual physical exam and follow-up on hypertension.  He has been out of his amlodipine  and valsartan  for about four to five months, stating 'I just forgot.' He has not experienced any side effects from his previous medications.  He requests a refill of venlafaxine , which he uses to manage anxiety. He ran out of this medication today after receiving a 30-day supply. Venlafaxine  has been effective in keeping his anxiety levels down.  No new health problems since his last visit and reports staying healthy. No issues with hearing, tinnitus, breathing difficulties, dyspnea, gastrointestinal problems, cramping, or changes in bowel habits. He continues to use the [facility] on [address] for his prescriptions.  He works at [employer] and reports that his job is going well. He denies smoking and reports minimal alcohol consumption, stating he had one drink last night, which was the first in three months.        History reviewed. No pertinent past medical history. Past Surgical History:  Procedure Laterality Date   NO PAST SURGERIES     Social History   Socioeconomic History   Marital status: Single    Spouse name: Not on file   Number of children: Not on file   Years of education: Not on file   Highest education level: Not on file  Occupational History    Not on file  Tobacco Use   Smoking status: Never   Smokeless tobacco: Never  Vaping Use   Vaping status: Never Used  Substance and Sexual Activity   Alcohol use: Yes    Comment: occasionally    Drug use: No   Sexual activity: Not on file  Other Topics Concern   Not on file  Social History Narrative   Not on file   Social Drivers of Health   Financial Resource Strain: Not on file  Food Insecurity: Not on file  Transportation Needs: Not on file  Physical Activity: Not on file  Stress: Not on file  Social Connections: Not on file  Intimate Partner Violence: Not on file   Family Status  Relation Name Status   Mother  Alive   Father  Alive   MGF  Deceased   Neg Hx  (Not Specified)  No partnership data on file   Family History  Problem Relation Age of Onset   Healthy Mother    Hypertension Father    Hyperlipidemia Father    Cancer Maternal Grandfather    Colon cancer Neg Hx    Prostate cancer Neg Hx    No Known Allergies  Patient Care Team: Perry Nancyann BRAVO, MD as PCP - General (Family Medicine)   Medications: Outpatient Medications Prior to Visit  Medication Sig   venlafaxine  XR (EFFEXOR -XR) 75 MG 24 hr capsule TAKE ONE CAPSULE BY MOUTH DAILY  amLODipine  (NORVASC ) 10 MG tablet Take 1 tablet (10 mg total) by mouth daily. (Patient not taking: Reported on 03/18/2024)   valsartan  (DIOVAN ) 80 MG tablet TAKE 1 TABLET(80 MG) BY MOUTH DAILY (Patient not taking: Reported on 03/18/2024)   No facility-administered medications prior to visit.    Review of Systems  Constitutional:  Negative for appetite change, chills and fever.  Respiratory:  Negative for chest tightness, shortness of breath and wheezing.   Cardiovascular:  Negative for chest pain and palpitations.  Gastrointestinal:  Negative for abdominal pain, nausea and vomiting.      Objective    BP (!) 133/106 (BP Location: Left Arm, Patient Position: Sitting, Cuff Size: Normal)   Pulse 89   Resp 16   Ht  5' 11 (1.803 m)   Wt 193 lb 3.2 oz (87.6 kg)   SpO2 98%   BMI 26.95 kg/m    Physical Exam  General Appearance:    Well developed, well nourished male. Alert, cooperative, in no acute distress, appears stated age  Head:    Normocephalic, without obvious abnormality, atraumatic  Eyes:    PERRL, conjunctiva/corneas clear, EOM's intact, fundi    benign, both eyes       Ears:    Normal TM's and external ear canals, both ears  Nose:   Nares normal, septum midline, mucosa normal, no drainage   or sinus tenderness  Throat:   Lips, mucosa, and tongue normal; teeth and gums normal  Neck:   Supple, symmetrical, trachea midline, no adenopathy;       thyroid:  No enlargement/tenderness/nodules; no carotid   bruit or JVD  Back:     Symmetric, no curvature, ROM normal, no CVA tenderness  Lungs:     Clear to auscultation bilaterally, respirations unlabored  Chest wall:    No tenderness or deformity  Heart:    Normal heart rate. Normal rhythm. No murmurs, rubs, or gallops.  S1 and S2 normal  Abdomen:     Soft, non-tender, bowel sounds active all four quadrants,    no masses, no organomegaly  Genitalia:    deferred  Rectal:    deferred  Extremities:   All extremities are intact. No cyanosis or edema  Pulses:   2+ and symmetric all extremities  Skin:   Skin color, texture, turgor normal, no rashes or lesions  Lymph nodes:   Cervical, supraclavicular, and axillary nodes normal  Neurologic:   CNII-XII intact. Normal strength, sensation and reflexes      throughout    Last depression screening scores    03/18/2024   10:57 AM 12/23/2022    3:40 PM 11/11/2022    1:37 PM  PHQ 2/9 Scores  PHQ - 2 Score 2 4 2   PHQ- 9 Score 8 11 9    Last fall risk screening    03/18/2024   10:57 AM  Fall Risk   Falls in the past year? 0  Number falls in past yr: 0  Injury with Fall? 0  Risk for fall due to : No Fall Risks   Last Audit-C alcohol use screening    05/06/2022   10:24 AM  Alcohol Use Disorder  Test (AUDIT)  1. How often do you have a drink containing alcohol? 2  2. How many drinks containing alcohol do you have on a typical day when you are drinking? 0  3. How often do you have six or more drinks on one occasion? 0  AUDIT-C Score 2   A score of  3 or more in women, and 4 or more in men indicates increased risk for alcohol abuse, EXCEPT if all of the points are from question 1     Assessment & Plan    Routine Health Maintenance and Physical Exam  Exercise Activities and Dietary recommendations  Goals   None     Immunization History  Administered Date(s) Administered   DTaP 03/31/1989, 06/28/1989, 08/18/1989, 05/18/1990, 07/23/1993   HIB (PRP-OMP) 08/18/1989, 10/16/1989, 05/18/1990   Hepatitis B 04/13/2000, 05/16/2000, 10/06/2000   IPV 03/31/1989, 06/28/1989, 05/18/1990, 07/23/1993   Influenza, Seasonal, Injecte, Preservative Fre 03/18/2024   Influenza,inj,Quad PF,6+ Mos 02/24/2021   MMR 05/18/1990, 07/23/1993   PFIZER(Purple Top)SARS-COV-2 Vaccination 09/14/2019, 10/08/2019, 05/25/2020   Pneumococcal-Unspecified 05/18/1990   Tdap 09/10/2021    Health Maintenance  Topic Date Due   HIV Screening  Never done   HPV VACCINES (1 - 3-dose SCDM series) Never done   COVID-19 Vaccine (4 - 2025-26 season) 02/05/2024   Hepatitis C Screening  12/23/2027   DTaP/Tdap/Td (7 - Td or Tdap) 09/11/2031   Influenza Vaccine  Completed   Hepatitis B Vaccines 19-59 Average Risk  Completed   Pneumococcal Vaccine  Aged Out   Meningococcal B Vaccine  Aged Out    Discussed health benefits of physical activity, and encouraged him to engage in regular exercise appropriate for his age and condition.   2. Flu vaccine  due (Primary)  - Flu vaccine trivalent PF, 6mos and older(Flulaval,Afluria,Fluarix,Fluzone)  3. Primary hypertension Currently off of amlodipine  and valsartan  for several months.   restart amLODipine  (NORVASC ) 10 MG tablet; Take 1/2 tablet daily for 8 days, then  increase to 1 full tablet daily  Dispense: 90 tablet; Refill: 1 - Renal function panel  Follow up to check BP and consider restarting valsartan  in 1 month.   4.  Anxiety and depression  - venlafaxine  XR (EFFEXOR -XR) 75 MG 24 hr capsule; Take 1 capsule (75 mg total) by mouth daily.  Dispense: 90 capsule; Refill: 0        Nancyann Perry, MD  [facility] [phone] (phone) [phone] (fax)  [facility]

## 2024-03-19 ENCOUNTER — Ambulatory Visit: Payer: Self-pay | Admitting: Family Medicine

## 2024-03-19 LAB — RENAL FUNCTION PANEL
Albumin: 4.7 g/dL (ref 4.1–5.1)
BUN/Creatinine Ratio: 9 (ref 9–20)
BUN: 10 mg/dL (ref 6–20)
CO2: 24 mmol/L (ref 20–29)
Calcium: 9.4 mg/dL (ref 8.7–10.2)
Chloride: 102 mmol/L (ref 96–106)
Creatinine, Ser: 1.12 mg/dL (ref 0.76–1.27)
Glucose: 90 mg/dL (ref 70–99)
Phosphorus: 3.3 mg/dL (ref 2.8–4.1)
Potassium: 4.1 mmol/L (ref 3.5–5.2)
Sodium: 138 mmol/L (ref 134–144)
eGFR: 88 mL/min/1.73 (ref >59)

## 2024-04-29 ENCOUNTER — Encounter: Payer: Self-pay | Admitting: Family Medicine

## 2024-04-29 ENCOUNTER — Ambulatory Visit: Admitting: Family Medicine

## 2024-04-29 VITALS — BP 129/86 | HR 111 | Wt 191.8 lb

## 2024-04-29 DIAGNOSIS — I10 Essential (primary) hypertension: Secondary | ICD-10-CM | POA: Diagnosis not present

## 2024-04-29 NOTE — Patient Instructions (Signed)
 Marcus Cook  Please review the attached list of medications and notify my office if there are any errors.   . Please bring all of your medications to every appointment so we can make sure that our medication list is the same as yours.

## 2024-04-29 NOTE — Progress Notes (Signed)
      Established patient visit   Patient: Marcus Cook   DOB: 01-19-1991   33 y.o. Male  MRN: [id number] Visit Date: 04/29/2024  Today's healthcare provider: Nancyann Perry, MD   Chief Complaint  Patient presents with   Follow-up    6 week BP F/U   Subjective    HPI Follow up since starting back on amlodipine  6 weeks ago and titrating up to previous dose of 10mg  a day. Feels well, no complaints.   Medications: Outpatient Medications Prior to Visit  Medication Sig   amLODipine  (NORVASC ) 10 MG tablet Take 1/2 tablet daily for 8 days, then increase to 1 full tablet daily   venlafaxine  XR (EFFEXOR -XR) 75 MG 24 hr capsule Take 1 capsule (75 mg total) by mouth daily.   valsartan  (DIOVAN ) 80 MG tablet TAKE 1 TABLET(80 MG) BY MOUTH DAILY (Patient not taking: Reported on 04/29/2024)   No facility-administered medications prior to visit.    Review of Systems  Constitutional:  Negative for appetite change, chills and fever.  Respiratory:  Negative for chest tightness, shortness of breath and wheezing.   Cardiovascular:  Negative for chest pain and palpitations.  Gastrointestinal:  Negative for abdominal pain, nausea and vomiting.       Objective    BP 129/86 (BP Location: Left Arm, Patient Position: Sitting, Cuff Size: Normal)   Pulse (!) 111   Wt 191 lb 12.8 oz (87 kg)   SpO2 99%   BMI 26.75 kg/m    Physical Exam   General: Appearance:    Well developed, well nourished male in no acute distress  Eyes:    PERRL, conjunctiva/corneas clear, EOM's intact       Lungs:     Clear to auscultation bilaterally, respirations unlabored  Heart:    Tachycardic. Normal rhythm. No murmurs, rubs, or gallops.    MS:   All extremities are intact.    Neurologic:   Awake, alert, oriented x 3. No apparent focal neurological defect.         Assessment & Plan     1. Primary hypertension (Primary) Well controlled.  Continue current medications.  Slightly tachycardic today with  otherwise normal rhythm.    Return in about 16 weeks (around 08/19/2024).         Nancyann Perry, MD  [facility] [phone] (phone) [phone] (fax)  [facility]

## 2024-06-12 ENCOUNTER — Other Ambulatory Visit: Payer: Self-pay | Admitting: Family Medicine

## 2024-06-12 DIAGNOSIS — F419 Anxiety disorder, unspecified: Secondary | ICD-10-CM

## 2024-06-12 NOTE — Telephone Encounter (Signed)
 Copied from CRM [id number]. Topic: Clinical - Medication Refill >> Jun 12, 2024 10:11 AM Amy B wrote: Medication: venlafaxine  XR (EFFEXOR -XR) 75 MG 24 hr capsule  Has the patient contacted their pharmacy? Yes (Agent: If no, request that the patient contact the pharmacy for the refill. If patient does not wish to contact the pharmacy document the reason why and proceed with request.) (Agent: If yes, when and what did the pharmacy advise?)  This is the patient's preferred pharmacy:  [id number] [place], [place] - [address] AT [facility] OF [address] [place] [address] Phone: [phone] Fax: [phone]  Is this the correct pharmacy for this prescription? Yes If no, delete pharmacy and type the correct one.   Has the prescription been filled recently? No  Is the patient out of the medication? No  Has the patient been seen for an appointment in the last year OR does the patient have an upcoming appointment? Yes  Can we respond through MyChart? Yes  Agent: Please be advised that Rx refills may take up to 3 business days. We ask that you follow-up with your pharmacy.

## 2024-06-13 MED ORDER — VENLAFAXINE HCL ER 75 MG PO CP24: 75.0000 mg | ORAL_CAPSULE | Freq: Every day | ORAL | 0 refills | Status: AC

## 2024-06-13 NOTE — Telephone Encounter (Signed)
 Requested medications are due for refill today.  yes  Requested medications are on the active medications list.  yes  Last refill. 03/18/2024 #90 0 rf  Future visit scheduled.   yes  Notes to clinic.  Expired labs.    Requested Prescriptions  Pending Prescriptions Disp Refills   venlafaxine  XR (EFFEXOR -XR) 75 MG 24 hr capsule 90 capsule 0    Sig: Take 1 capsule (75 mg total) by mouth daily.     Psychiatry: Antidepressants - SNRI - desvenlafaxine & venlafaxine  Failed - 06/13/2024  1:00 PM      Failed - Lipid Panel in normal range within the last 12 months    Cholesterol, Total  Date Value Ref Range Status  12/23/2022 165 100 - 199 mg/dL Final   LDL Chol Calc (NIH)  Date Value Ref Range Status  12/23/2022 84 0 - 99 mg/dL Final   HDL  Date Value Ref Range Status  12/23/2022 67 >39 mg/dL Final   Triglycerides  Date Value Ref Range Status  12/23/2022 71 0 - 149 mg/dL Final         Passed - Cr in normal range and within 360 days    Creatinine  Date Value Ref Range Status  07/11/2011 1.06 0.60 - 1.30 mg/dL Final   Creatinine, Ser  Date Value Ref Range Status  03/18/2024 1.12 0.76 - 1.27 mg/dL Final         Passed - Last BP in normal range    BP Readings from Last 1 Encounters:  04/29/24 129/86         Passed - Valid encounter within last 6 months    Recent Outpatient Visits           1 month ago Primary hypertension   [facility] [place] Nancyann BRAVO, MD   2 months ago Immunization due   [facility] [place], Nancyann BRAVO, MD

## 2024-08-28 ENCOUNTER — Ambulatory Visit: Admitting: Family Medicine

## 2024-09-02 ENCOUNTER — Ambulatory Visit: Admitting: Family Medicine
